# Patient Record
Sex: Female | Born: 1935 | Race: White | Hispanic: No | Smoking: Never smoker
Health system: Southern US, Community
[De-identification: ages and names within clinical notes are randomized; demographics above are authoritative.]

## PROBLEM LIST (undated history)

## (undated) DIAGNOSIS — I499 Cardiac arrhythmia, unspecified: Secondary | ICD-10-CM

## (undated) DIAGNOSIS — M1990 Unspecified osteoarthritis, unspecified site: Secondary | ICD-10-CM

## (undated) DIAGNOSIS — I10 Essential (primary) hypertension: Secondary | ICD-10-CM

## (undated) HISTORY — PX: ABDOMINAL HYSTERECTOMY: SHX81

## (undated) HISTORY — PX: KNEE SURGERY: SHX244

## (undated) HISTORY — PX: WRIST SURGERY: SHX841

## (undated) HISTORY — PX: BREAST LUMPECTOMY: SHX2

## (undated) HISTORY — PX: OTHER SURGICAL HISTORY: SHX169

---

## 2005-06-06 ENCOUNTER — Ambulatory Visit: Payer: Self-pay | Admitting: Internal Medicine

## 2006-06-17 ENCOUNTER — Ambulatory Visit: Payer: Self-pay | Admitting: Internal Medicine

## 2006-09-24 ENCOUNTER — Ambulatory Visit: Payer: Self-pay | Admitting: Ophthalmology

## 2007-06-19 ENCOUNTER — Ambulatory Visit: Payer: Self-pay | Admitting: Internal Medicine

## 2008-03-14 ENCOUNTER — Ambulatory Visit: Payer: Self-pay | Admitting: Unknown Physician Specialty

## 2008-03-24 ENCOUNTER — Inpatient Hospital Stay: Payer: Self-pay | Admitting: Unknown Physician Specialty

## 2008-04-05 ENCOUNTER — Ambulatory Visit: Payer: Self-pay | Admitting: Internal Medicine

## 2008-04-28 ENCOUNTER — Ambulatory Visit: Payer: Self-pay | Admitting: Unknown Physician Specialty

## 2009-05-24 ENCOUNTER — Ambulatory Visit: Payer: Self-pay | Admitting: Internal Medicine

## 2010-07-25 ENCOUNTER — Ambulatory Visit: Payer: Self-pay | Admitting: Internal Medicine

## 2010-11-09 ENCOUNTER — Ambulatory Visit: Payer: Self-pay | Admitting: Ophthalmology

## 2011-09-04 ENCOUNTER — Ambulatory Visit: Payer: Self-pay | Admitting: Podiatry

## 2011-09-27 ENCOUNTER — Ambulatory Visit: Payer: Self-pay | Admitting: Internal Medicine

## 2012-09-28 ENCOUNTER — Ambulatory Visit: Payer: Self-pay | Admitting: Physician Assistant

## 2013-07-05 ENCOUNTER — Ambulatory Visit: Payer: Self-pay | Admitting: Otolaryngology

## 2013-07-05 LAB — CREATININE, SERUM: EGFR (Non-African Amer.): >60

## 2013-07-27 ENCOUNTER — Ambulatory Visit: Payer: Self-pay | Admitting: Neurology

## 2013-09-08 DIAGNOSIS — G500 Trigeminal neuralgia: Secondary | ICD-10-CM | POA: Insufficient documentation

## 2013-10-04 ENCOUNTER — Ambulatory Visit: Payer: Self-pay | Admitting: Physician Assistant

## 2014-05-19 DIAGNOSIS — Z96652 Presence of left artificial knee joint: Secondary | ICD-10-CM | POA: Insufficient documentation

## 2015-03-08 DIAGNOSIS — M810 Age-related osteoporosis without current pathological fracture: Secondary | ICD-10-CM | POA: Insufficient documentation

## 2015-03-14 ENCOUNTER — Ambulatory Visit
Admit: 2015-03-14 | Disposition: A | Discharge status: Home / Self Care | Payer: Self-pay | Attending: Physician Assistant | Admitting: Physician Assistant

## 2016-03-13 DIAGNOSIS — M150 Primary generalized (osteo)arthritis: Secondary | ICD-10-CM | POA: Diagnosis not present

## 2016-03-13 DIAGNOSIS — I10 Essential (primary) hypertension: Secondary | ICD-10-CM | POA: Diagnosis not present

## 2016-03-13 DIAGNOSIS — M810 Age-related osteoporosis without current pathological fracture: Secondary | ICD-10-CM | POA: Diagnosis not present

## 2016-03-13 DIAGNOSIS — E782 Mixed hyperlipidemia: Secondary | ICD-10-CM | POA: Diagnosis not present

## 2016-05-02 DIAGNOSIS — M1711 Unilateral primary osteoarthritis, right knee: Secondary | ICD-10-CM | POA: Diagnosis not present

## 2016-05-02 DIAGNOSIS — M25561 Pain in right knee: Secondary | ICD-10-CM | POA: Diagnosis not present

## 2016-05-24 DIAGNOSIS — R42 Dizziness and giddiness: Secondary | ICD-10-CM | POA: Diagnosis not present

## 2016-05-24 DIAGNOSIS — R55 Syncope and collapse: Secondary | ICD-10-CM | POA: Diagnosis not present

## 2016-05-31 DIAGNOSIS — I6523 Occlusion and stenosis of bilateral carotid arteries: Secondary | ICD-10-CM | POA: Diagnosis not present

## 2016-05-31 DIAGNOSIS — R42 Dizziness and giddiness: Secondary | ICD-10-CM | POA: Diagnosis not present

## 2016-06-27 DIAGNOSIS — M1711 Unilateral primary osteoarthritis, right knee: Secondary | ICD-10-CM | POA: Diagnosis not present

## 2016-07-04 DIAGNOSIS — M1711 Unilateral primary osteoarthritis, right knee: Secondary | ICD-10-CM | POA: Diagnosis not present

## 2016-07-11 DIAGNOSIS — M1711 Unilateral primary osteoarthritis, right knee: Secondary | ICD-10-CM | POA: Diagnosis not present

## 2016-09-12 DIAGNOSIS — E782 Mixed hyperlipidemia: Secondary | ICD-10-CM | POA: Diagnosis not present

## 2016-09-12 DIAGNOSIS — I10 Essential (primary) hypertension: Secondary | ICD-10-CM | POA: Diagnosis not present

## 2016-09-12 DIAGNOSIS — G500 Trigeminal neuralgia: Secondary | ICD-10-CM | POA: Diagnosis not present

## 2016-09-12 DIAGNOSIS — M818 Other osteoporosis without current pathological fracture: Secondary | ICD-10-CM | POA: Diagnosis not present

## 2016-09-12 DIAGNOSIS — H04123 Dry eye syndrome of bilateral lacrimal glands: Secondary | ICD-10-CM | POA: Diagnosis not present

## 2016-09-12 DIAGNOSIS — R202 Paresthesia of skin: Secondary | ICD-10-CM | POA: Diagnosis not present

## 2016-09-12 DIAGNOSIS — M150 Primary generalized (osteo)arthritis: Secondary | ICD-10-CM | POA: Diagnosis not present

## 2016-09-30 DIAGNOSIS — K529 Noninfective gastroenteritis and colitis, unspecified: Secondary | ICD-10-CM | POA: Diagnosis not present

## 2016-09-30 DIAGNOSIS — R197 Diarrhea, unspecified: Secondary | ICD-10-CM | POA: Diagnosis not present

## 2016-09-30 DIAGNOSIS — R140 Abdominal distension (gaseous): Secondary | ICD-10-CM | POA: Diagnosis not present

## 2016-10-01 DIAGNOSIS — R140 Abdominal distension (gaseous): Secondary | ICD-10-CM | POA: Diagnosis not present

## 2016-10-01 DIAGNOSIS — K529 Noninfective gastroenteritis and colitis, unspecified: Secondary | ICD-10-CM | POA: Diagnosis not present

## 2016-10-15 DIAGNOSIS — K529 Noninfective gastroenteritis and colitis, unspecified: Secondary | ICD-10-CM | POA: Diagnosis not present

## 2016-10-15 DIAGNOSIS — A0472 Enterocolitis due to Clostridium difficile, not specified as recurrent: Secondary | ICD-10-CM | POA: Diagnosis not present

## 2016-11-13 DIAGNOSIS — J029 Acute pharyngitis, unspecified: Secondary | ICD-10-CM | POA: Diagnosis not present

## 2016-11-13 DIAGNOSIS — J069 Acute upper respiratory infection, unspecified: Secondary | ICD-10-CM | POA: Diagnosis not present

## 2016-11-29 DIAGNOSIS — H8111 Benign paroxysmal vertigo, right ear: Secondary | ICD-10-CM | POA: Diagnosis not present

## 2017-03-18 DIAGNOSIS — Z0000 Encounter for general adult medical examination without abnormal findings: Secondary | ICD-10-CM | POA: Diagnosis not present

## 2017-03-18 DIAGNOSIS — I10 Essential (primary) hypertension: Secondary | ICD-10-CM | POA: Diagnosis not present

## 2017-03-18 DIAGNOSIS — M818 Other osteoporosis without current pathological fracture: Secondary | ICD-10-CM | POA: Diagnosis not present

## 2017-03-18 DIAGNOSIS — G500 Trigeminal neuralgia: Secondary | ICD-10-CM | POA: Diagnosis not present

## 2017-03-18 DIAGNOSIS — R739 Hyperglycemia, unspecified: Secondary | ICD-10-CM | POA: Diagnosis not present

## 2017-03-18 DIAGNOSIS — E782 Mixed hyperlipidemia: Secondary | ICD-10-CM | POA: Diagnosis not present

## 2017-03-18 DIAGNOSIS — R202 Paresthesia of skin: Secondary | ICD-10-CM | POA: Diagnosis not present

## 2017-06-26 DIAGNOSIS — M19031 Primary osteoarthritis, right wrist: Secondary | ICD-10-CM | POA: Diagnosis not present

## 2017-06-26 DIAGNOSIS — G5601 Carpal tunnel syndrome, right upper limb: Secondary | ICD-10-CM | POA: Diagnosis not present

## 2017-06-26 DIAGNOSIS — M25531 Pain in right wrist: Secondary | ICD-10-CM | POA: Diagnosis not present

## 2017-07-01 DIAGNOSIS — M19031 Primary osteoarthritis, right wrist: Secondary | ICD-10-CM | POA: Diagnosis not present

## 2017-07-05 ENCOUNTER — Emergency Department
Admission: EM | Admit: 2017-07-05 | Discharge: 2017-07-05 | Disposition: A | Discharge status: Home / Self Care | Payer: PPO | Attending: Emergency Medicine | Admitting: Emergency Medicine

## 2017-07-05 ENCOUNTER — Emergency Department: Payer: PPO

## 2017-07-05 DIAGNOSIS — M542 Cervicalgia: Secondary | ICD-10-CM | POA: Diagnosis not present

## 2017-07-05 DIAGNOSIS — W108XXA Fall (on) (from) other stairs and steps, initial encounter: Secondary | ICD-10-CM | POA: Insufficient documentation

## 2017-07-05 DIAGNOSIS — S199XXA Unspecified injury of neck, initial encounter: Secondary | ICD-10-CM | POA: Diagnosis not present

## 2017-07-05 DIAGNOSIS — Z79899 Other long term (current) drug therapy: Secondary | ICD-10-CM | POA: Diagnosis not present

## 2017-07-05 DIAGNOSIS — S0101XA Laceration without foreign body of scalp, initial encounter: Secondary | ICD-10-CM | POA: Diagnosis not present

## 2017-07-05 DIAGNOSIS — W01198A Fall on same level from slipping, tripping and stumbling with subsequent striking against other object, initial encounter: Secondary | ICD-10-CM | POA: Diagnosis not present

## 2017-07-05 DIAGNOSIS — Y999 Unspecified external cause status: Secondary | ICD-10-CM | POA: Diagnosis not present

## 2017-07-05 DIAGNOSIS — I10 Essential (primary) hypertension: Secondary | ICD-10-CM | POA: Diagnosis not present

## 2017-07-05 DIAGNOSIS — Y92017 Garden or yard in single-family (private) house as the place of occurrence of the external cause: Secondary | ICD-10-CM | POA: Insufficient documentation

## 2017-07-05 DIAGNOSIS — W19XXXA Unspecified fall, initial encounter: Secondary | ICD-10-CM

## 2017-07-05 DIAGNOSIS — S0990XA Unspecified injury of head, initial encounter: Secondary | ICD-10-CM

## 2017-07-05 DIAGNOSIS — Y93H2 Activity, gardening and landscaping: Secondary | ICD-10-CM | POA: Diagnosis not present

## 2017-07-05 HISTORY — DX: Essential (primary) hypertension: I10

## 2017-07-05 MED ORDER — LIDOCAINE HCL (PF) 1 % IJ SOLN
5.0000 mL | Freq: Once | INTRAMUSCULAR | Status: AC
  Administered 2017-07-05: 5 mL via INTRADERMAL
  Filled 2017-07-05: qty 5

## 2017-07-05 NOTE — ED Notes (Signed)
Pt discharged to home.  Family member driving.  Discharge instructions reviewed.  Verbalized understanding.  No questions or concerns at this time.  Teach back verified.  Pt in NAD.  No items left in ED.   

## 2017-07-05 NOTE — ED Provider Notes (Signed)
[facility] Emergency Department Provider Note   ____________________________________________   I have reviewed the triage vital signs and the nursing notes.   HISTORY  Chief Complaint Fall   History limited by: Not Limited   HPI Debra Gutierrez is a 83 y.o. female who presents to the emergency department today after a fall. The patient was working on her garden when she fell backwards. She thinks she might have missed a step going down. She did hit the back of her head against a metal carport pole and thinks she might have briefly lost consciousness. She is having a little bit of pain to the back of her head. When she noticed bleeding she walked herself to her neighbors house. She denies any chest pain or shortness of breath. No recent illness.    Past Medical History:  Diagnosis Date  . Hypertension     There are no active problems to display for this patient.   Past Surgical History:  Procedure Laterality Date  . BREAST LUMPECTOMY    . KNEE SURGERY      Prior to Admission medications   Not on File    Allergies Daypro [oxaprozin] and Penicillins  No family history on file.  Social History Social History  Substance Use Topics  . Smoking status: Never Smoker  . Smokeless tobacco: Never Used  . Alcohol use No    Review of Systems Constitutional: No fever/chills Eyes: Brief episode of blurry vision immediately after the injury. ENT: No sore throat. Cardiovascular: Denies chest pain. Respiratory: Denies shortness of breath. Gastrointestinal: No abdominal pain.  No nausea, no vomiting.  No diarrhea.   Genitourinary: Negative for dysuria. Musculoskeletal: Negative for back pain. Skin: Positive for laceration to the back of her head. Neurological: Negative for headaches, focal weakness or numbness.  ____________________________________________   PHYSICAL EXAM:  VITAL SIGNS: ED Triage Vitals  Enc Vitals Group     BP 07/05/17  2049 (!) 205/93     Pulse Rate 07/05/17 2049 (!) 52     Resp 07/05/17 2049 18     Temp 07/05/17 2049 98.4 F (36.9 C)     Temp Source 07/05/17 2049 Oral     SpO2 07/05/17 2049 97 %     Weight 07/05/17 2050 136 lb (61.7 kg)     Height 07/05/17 2050 5\' 2"  (1.575 m)     Head Circumference --      Peak Flow --      Pain Score 07/05/17 2049 10    Constitutional: Alert and oriented. Well appearing and in no distress. Eyes: Conjunctivae are normal.  ENT   Head: Normocephalic. Hematoma and laceration to back of head.   Nose: No congestion/rhinnorhea.   Mouth/Throat: Mucous membranes are moist.   Neck: No stridor. Hematological/Lymphatic/Immunilogical: No cervical lymphadenopathy. Cardiovascular: Normal rate, regular rhythm.  No murmurs, rubs, or gallops.  Respiratory: Normal respiratory effort without tachypnea nor retractions. Breath sounds are clear and equal bilaterally. No wheezes/rales/rhonchi. Gastrointestinal: Soft and non tender. No rebound. No guarding.  Genitourinary: Deferred Musculoskeletal: Normal range of motion in all extremities. No lower extremity edema. Neurologic:  Normal speech and language. No gross focal neurologic deficits are appreciated.  Skin:  Skin is warm, dry and intact. No rash noted. Psychiatric: Mood and affect are normal. Speech and behavior are normal. Patient exhibits appropriate insight and judgment.  ____________________________________________    LABS (pertinent positives/negatives)  None  ____________________________________________   EKG  None  ____________________________________________    RADIOLOGY  CT head/cervical spine IMPRESSION:  1. No acute intracranial hemorrhage.  2. No acute/traumatic cervical spine pathology.      ____________________________________________   PROCEDURES  Procedures  LACERATION REPAIR Performed by: Phineas SemenGOODMAN, Steffi Noviello Authorized by: Phineas SemenGOODMAN, Katrianna Friesenhahn Consent: Verbal consent  obtained. Risks and benefits: risks, benefits and alternatives were discussed Consent given by: patient Patient identity confirmed: provided demographic data Prepped and Draped in normal sterile fashion Wound explored  Laceration Location: occiput of scalp  Laceration Length: 4 cm  No Foreign Bodies seen or palpated  Anesthesia: local infiltration  Local anesthetic: lidocaine 1% without epinephrine  Anesthetic total: 5 ml  Irrigation method: syringe Amount of cleaning: standard  Skin closure: staples  Number of sutures: 8  Technique: staple  Patient tolerance: Patient tolerated the procedure well with no immediate complications.  ____________________________________________   INITIAL IMPRESSION / ASSESSMENT AND PLAN / ED COURSE  Pertinent labs & imaging results that were available during my care of the patient were reviewed by me and considered in my medical decision making (see chart for details).  Patient presented to the emergency department today after a mechanical fall, falling backwards and hitting her head. The patient did suffer roughly 4 cm laceration and hematoma. CT of the head and neck were negative. Patient's laceration was closed with staples. Patient states last tetanus shot 6 years ago. Wound is not contaminated. Discussed return precautions with patient.   ____________________________________________   FINAL CLINICAL IMPRESSION(S) / ED DIAGNOSES  Final diagnoses:  Fall, initial encounter  Injury of head, initial encounter  Laceration of scalp, initial encounter     Note: This dictation was prepared with Nurse, children'sDragon dictation. Any transcriptional errors that result from this process are unintentional     Phineas SemenGoodman, Yon Schiffman, MD 07/05/17 2231

## 2017-07-05 NOTE — ED Notes (Signed)
Signature pad not working, verbalized understanding of discharge instructions.

## 2017-07-05 NOTE — Discharge Instructions (Signed)
Please seek medical attention for any high fevers, chest pain, shortness of breath, change in behavior, persistent vomiting, bloody stool or any other new or concerning symptoms.  

## 2017-07-05 NOTE — ED Triage Notes (Signed)
Pt arrived via [facility] from home.  Pt was picking flowers from her garden and fell backwards.  Pt hit the back of her head and has approx 4cm lac to back of head.  PT denies LOC.  Pt was able to ambulate across the street to her neighbors house after fall and call EMS from there.  Pt A&Ox4 and ambulatory from stretcher to bed in ER.  Pt's BP elevated per EMS.  PT states that she has taken her BP medication today.

## 2017-07-14 DIAGNOSIS — S0990XA Unspecified injury of head, initial encounter: Secondary | ICD-10-CM | POA: Diagnosis not present

## 2017-07-14 DIAGNOSIS — Z4802 Encounter for removal of sutures: Secondary | ICD-10-CM | POA: Diagnosis not present

## 2017-07-14 DIAGNOSIS — G500 Trigeminal neuralgia: Secondary | ICD-10-CM | POA: Diagnosis not present

## 2017-08-22 DIAGNOSIS — Z23 Encounter for immunization: Secondary | ICD-10-CM | POA: Diagnosis not present

## 2017-08-29 DIAGNOSIS — Z9889 Other specified postprocedural states: Secondary | ICD-10-CM | POA: Diagnosis not present

## 2017-08-29 DIAGNOSIS — Z8669 Personal history of other diseases of the nervous system and sense organs: Secondary | ICD-10-CM | POA: Diagnosis not present

## 2017-08-29 DIAGNOSIS — G500 Trigeminal neuralgia: Secondary | ICD-10-CM | POA: Diagnosis not present

## 2017-09-18 DIAGNOSIS — R7303 Prediabetes: Secondary | ICD-10-CM | POA: Diagnosis not present

## 2017-09-18 DIAGNOSIS — M818 Other osteoporosis without current pathological fracture: Secondary | ICD-10-CM | POA: Diagnosis not present

## 2017-09-18 DIAGNOSIS — R55 Syncope and collapse: Secondary | ICD-10-CM | POA: Diagnosis not present

## 2017-09-18 DIAGNOSIS — E782 Mixed hyperlipidemia: Secondary | ICD-10-CM | POA: Diagnosis not present

## 2017-09-18 DIAGNOSIS — R42 Dizziness and giddiness: Secondary | ICD-10-CM | POA: Diagnosis not present

## 2017-09-18 DIAGNOSIS — G500 Trigeminal neuralgia: Secondary | ICD-10-CM | POA: Diagnosis not present

## 2017-09-18 DIAGNOSIS — I10 Essential (primary) hypertension: Secondary | ICD-10-CM | POA: Diagnosis not present

## 2017-10-02 DIAGNOSIS — R55 Syncope and collapse: Secondary | ICD-10-CM | POA: Diagnosis not present

## 2017-10-02 DIAGNOSIS — R42 Dizziness and giddiness: Secondary | ICD-10-CM | POA: Diagnosis not present

## 2017-10-02 DIAGNOSIS — I6523 Occlusion and stenosis of bilateral carotid arteries: Secondary | ICD-10-CM | POA: Diagnosis not present

## 2017-12-30 DIAGNOSIS — K921 Melena: Secondary | ICD-10-CM | POA: Diagnosis not present

## 2018-03-24 DIAGNOSIS — Z0000 Encounter for general adult medical examination without abnormal findings: Secondary | ICD-10-CM | POA: Diagnosis not present

## 2018-03-24 DIAGNOSIS — E782 Mixed hyperlipidemia: Secondary | ICD-10-CM | POA: Diagnosis not present

## 2018-03-24 DIAGNOSIS — I10 Essential (primary) hypertension: Secondary | ICD-10-CM | POA: Diagnosis not present

## 2018-03-24 DIAGNOSIS — M818 Other osteoporosis without current pathological fracture: Secondary | ICD-10-CM | POA: Diagnosis not present

## 2018-03-24 DIAGNOSIS — M150 Primary generalized (osteo)arthritis: Secondary | ICD-10-CM | POA: Diagnosis not present

## 2018-03-24 DIAGNOSIS — R7303 Prediabetes: Secondary | ICD-10-CM | POA: Diagnosis not present

## 2018-06-05 DIAGNOSIS — H01003 Unspecified blepharitis right eye, unspecified eyelid: Secondary | ICD-10-CM | POA: Diagnosis not present

## 2018-09-16 DIAGNOSIS — I10 Essential (primary) hypertension: Secondary | ICD-10-CM | POA: Diagnosis not present

## 2018-09-16 DIAGNOSIS — E782 Mixed hyperlipidemia: Secondary | ICD-10-CM | POA: Diagnosis not present

## 2018-09-16 DIAGNOSIS — R7303 Prediabetes: Secondary | ICD-10-CM | POA: Diagnosis not present

## 2018-09-23 DIAGNOSIS — G500 Trigeminal neuralgia: Secondary | ICD-10-CM | POA: Diagnosis not present

## 2018-09-23 DIAGNOSIS — Z23 Encounter for immunization: Secondary | ICD-10-CM | POA: Diagnosis not present

## 2018-09-23 DIAGNOSIS — I10 Essential (primary) hypertension: Secondary | ICD-10-CM | POA: Diagnosis not present

## 2018-09-23 DIAGNOSIS — R7303 Prediabetes: Secondary | ICD-10-CM | POA: Diagnosis not present

## 2018-09-23 DIAGNOSIS — M150 Primary generalized (osteo)arthritis: Secondary | ICD-10-CM | POA: Diagnosis not present

## 2018-09-23 DIAGNOSIS — E782 Mixed hyperlipidemia: Secondary | ICD-10-CM | POA: Diagnosis not present

## 2019-06-03 DIAGNOSIS — R7303 Prediabetes: Secondary | ICD-10-CM | POA: Diagnosis not present

## 2019-06-03 DIAGNOSIS — I10 Essential (primary) hypertension: Secondary | ICD-10-CM | POA: Diagnosis not present

## 2019-06-03 DIAGNOSIS — E782 Mixed hyperlipidemia: Secondary | ICD-10-CM | POA: Diagnosis not present

## 2019-06-07 DIAGNOSIS — E782 Mixed hyperlipidemia: Secondary | ICD-10-CM | POA: Diagnosis not present

## 2019-06-07 DIAGNOSIS — R7303 Prediabetes: Secondary | ICD-10-CM | POA: Diagnosis not present

## 2019-06-07 DIAGNOSIS — I10 Essential (primary) hypertension: Secondary | ICD-10-CM | POA: Diagnosis not present

## 2019-06-07 DIAGNOSIS — G500 Trigeminal neuralgia: Secondary | ICD-10-CM | POA: Diagnosis not present

## 2019-06-07 DIAGNOSIS — Z0000 Encounter for general adult medical examination without abnormal findings: Secondary | ICD-10-CM | POA: Diagnosis not present

## 2019-09-16 ENCOUNTER — Other Ambulatory Visit: Payer: Self-pay | Admitting: Physician Assistant

## 2019-09-16 DIAGNOSIS — R1011 Right upper quadrant pain: Secondary | ICD-10-CM

## 2019-09-16 DIAGNOSIS — R197 Diarrhea, unspecified: Secondary | ICD-10-CM

## 2019-09-24 ENCOUNTER — Ambulatory Visit
Admission: RE | Admit: 2019-09-24 | Discharge: 2019-09-24 | Disposition: A | Discharge status: Home / Self Care | Payer: PPO | Source: Ambulatory Visit | Attending: Physician Assistant | Admitting: Physician Assistant

## 2019-09-24 ENCOUNTER — Other Ambulatory Visit: Payer: Self-pay

## 2019-09-24 DIAGNOSIS — R1011 Right upper quadrant pain: Secondary | ICD-10-CM | POA: Insufficient documentation

## 2019-09-24 DIAGNOSIS — R197 Diarrhea, unspecified: Secondary | ICD-10-CM

## 2019-09-24 DIAGNOSIS — K7689 Other specified diseases of liver: Secondary | ICD-10-CM | POA: Diagnosis not present

## 2019-09-24 DIAGNOSIS — K828 Other specified diseases of gallbladder: Secondary | ICD-10-CM | POA: Diagnosis not present

## 2019-10-18 DIAGNOSIS — Z961 Presence of intraocular lens: Secondary | ICD-10-CM | POA: Diagnosis not present

## 2019-12-06 DIAGNOSIS — R7303 Prediabetes: Secondary | ICD-10-CM | POA: Diagnosis not present

## 2019-12-06 DIAGNOSIS — G500 Trigeminal neuralgia: Secondary | ICD-10-CM | POA: Diagnosis not present

## 2019-12-06 DIAGNOSIS — I10 Essential (primary) hypertension: Secondary | ICD-10-CM | POA: Diagnosis not present

## 2019-12-06 DIAGNOSIS — E782 Mixed hyperlipidemia: Secondary | ICD-10-CM | POA: Diagnosis not present

## 2019-12-06 DIAGNOSIS — E538 Deficiency of other specified B group vitamins: Secondary | ICD-10-CM | POA: Diagnosis not present

## 2019-12-08 DIAGNOSIS — M8949 Other hypertrophic osteoarthropathy, multiple sites: Secondary | ICD-10-CM | POA: Diagnosis not present

## 2019-12-08 DIAGNOSIS — E782 Mixed hyperlipidemia: Secondary | ICD-10-CM | POA: Diagnosis not present

## 2019-12-08 DIAGNOSIS — Z0000 Encounter for general adult medical examination without abnormal findings: Secondary | ICD-10-CM | POA: Diagnosis not present

## 2019-12-08 DIAGNOSIS — E538 Deficiency of other specified B group vitamins: Secondary | ICD-10-CM | POA: Diagnosis not present

## 2019-12-08 DIAGNOSIS — R7303 Prediabetes: Secondary | ICD-10-CM | POA: Diagnosis not present

## 2019-12-08 DIAGNOSIS — I10 Essential (primary) hypertension: Secondary | ICD-10-CM | POA: Diagnosis not present

## 2019-12-08 DIAGNOSIS — H9190 Unspecified hearing loss, unspecified ear: Secondary | ICD-10-CM | POA: Diagnosis not present

## 2019-12-08 DIAGNOSIS — G500 Trigeminal neuralgia: Secondary | ICD-10-CM | POA: Diagnosis not present

## 2019-12-22 DIAGNOSIS — G253 Myoclonus: Secondary | ICD-10-CM | POA: Diagnosis not present

## 2019-12-22 DIAGNOSIS — H9319 Tinnitus, unspecified ear: Secondary | ICD-10-CM | POA: Diagnosis not present

## 2019-12-22 DIAGNOSIS — S0432XA Injury of trigeminal nerve, left side, initial encounter: Secondary | ICD-10-CM | POA: Diagnosis not present

## 2019-12-22 DIAGNOSIS — H903 Sensorineural hearing loss, bilateral: Secondary | ICD-10-CM | POA: Diagnosis not present

## 2020-06-01 DIAGNOSIS — I10 Essential (primary) hypertension: Secondary | ICD-10-CM | POA: Diagnosis not present

## 2020-06-01 DIAGNOSIS — E782 Mixed hyperlipidemia: Secondary | ICD-10-CM | POA: Diagnosis not present

## 2020-06-01 DIAGNOSIS — R7303 Prediabetes: Secondary | ICD-10-CM | POA: Diagnosis not present

## 2020-06-08 DIAGNOSIS — Z23 Encounter for immunization: Secondary | ICD-10-CM | POA: Diagnosis not present

## 2020-06-08 DIAGNOSIS — G500 Trigeminal neuralgia: Secondary | ICD-10-CM | POA: Diagnosis not present

## 2020-06-08 DIAGNOSIS — Z0000 Encounter for general adult medical examination without abnormal findings: Secondary | ICD-10-CM | POA: Diagnosis not present

## 2020-06-08 DIAGNOSIS — E782 Mixed hyperlipidemia: Secondary | ICD-10-CM | POA: Diagnosis not present

## 2020-06-08 DIAGNOSIS — M8949 Other hypertrophic osteoarthropathy, multiple sites: Secondary | ICD-10-CM | POA: Diagnosis not present

## 2020-06-08 DIAGNOSIS — R7303 Prediabetes: Secondary | ICD-10-CM | POA: Diagnosis not present

## 2020-06-08 DIAGNOSIS — I10 Essential (primary) hypertension: Secondary | ICD-10-CM | POA: Diagnosis not present

## 2020-08-30 DIAGNOSIS — I10 Essential (primary) hypertension: Secondary | ICD-10-CM | POA: Diagnosis not present

## 2020-08-30 DIAGNOSIS — Z23 Encounter for immunization: Secondary | ICD-10-CM | POA: Diagnosis not present

## 2020-09-19 DIAGNOSIS — N9089 Other specified noninflammatory disorders of vulva and perineum: Secondary | ICD-10-CM | POA: Diagnosis not present

## 2020-12-14 DIAGNOSIS — R42 Dizziness and giddiness: Secondary | ICD-10-CM | POA: Diagnosis not present

## 2020-12-14 DIAGNOSIS — I10 Essential (primary) hypertension: Secondary | ICD-10-CM | POA: Diagnosis not present

## 2021-01-08 DIAGNOSIS — E782 Mixed hyperlipidemia: Secondary | ICD-10-CM | POA: Diagnosis not present

## 2021-01-08 DIAGNOSIS — Z0000 Encounter for general adult medical examination without abnormal findings: Secondary | ICD-10-CM | POA: Diagnosis not present

## 2021-01-08 DIAGNOSIS — E538 Deficiency of other specified B group vitamins: Secondary | ICD-10-CM | POA: Diagnosis not present

## 2021-01-08 DIAGNOSIS — I10 Essential (primary) hypertension: Secondary | ICD-10-CM | POA: Diagnosis not present

## 2021-01-08 DIAGNOSIS — G500 Trigeminal neuralgia: Secondary | ICD-10-CM | POA: Diagnosis not present

## 2021-01-08 DIAGNOSIS — R7303 Prediabetes: Secondary | ICD-10-CM | POA: Diagnosis not present

## 2021-01-08 DIAGNOSIS — Z23 Encounter for immunization: Secondary | ICD-10-CM | POA: Diagnosis not present

## 2021-01-08 DIAGNOSIS — M8949 Other hypertrophic osteoarthropathy, multiple sites: Secondary | ICD-10-CM | POA: Diagnosis not present

## 2021-01-08 DIAGNOSIS — H9190 Unspecified hearing loss, unspecified ear: Secondary | ICD-10-CM | POA: Diagnosis not present

## 2021-03-12 DIAGNOSIS — M545 Low back pain, unspecified: Secondary | ICD-10-CM | POA: Diagnosis not present

## 2021-03-12 DIAGNOSIS — G8929 Other chronic pain: Secondary | ICD-10-CM | POA: Diagnosis not present

## 2021-04-10 DIAGNOSIS — K625 Hemorrhage of anus and rectum: Secondary | ICD-10-CM | POA: Diagnosis not present

## 2021-04-10 DIAGNOSIS — R000 Tachycardia, unspecified: Secondary | ICD-10-CM | POA: Diagnosis not present

## 2021-04-10 DIAGNOSIS — I10 Essential (primary) hypertension: Secondary | ICD-10-CM | POA: Diagnosis not present

## 2021-04-12 DIAGNOSIS — K625 Hemorrhage of anus and rectum: Secondary | ICD-10-CM | POA: Diagnosis not present

## 2021-05-10 DIAGNOSIS — K5900 Constipation, unspecified: Secondary | ICD-10-CM | POA: Diagnosis not present

## 2021-05-10 DIAGNOSIS — K625 Hemorrhage of anus and rectum: Secondary | ICD-10-CM | POA: Diagnosis not present

## 2021-05-10 DIAGNOSIS — Z8719 Personal history of other diseases of the digestive system: Secondary | ICD-10-CM | POA: Diagnosis not present

## 2021-05-14 DIAGNOSIS — K625 Hemorrhage of anus and rectum: Secondary | ICD-10-CM | POA: Diagnosis not present

## 2021-06-05 DIAGNOSIS — E782 Mixed hyperlipidemia: Secondary | ICD-10-CM | POA: Diagnosis not present

## 2021-06-05 DIAGNOSIS — E538 Deficiency of other specified B group vitamins: Secondary | ICD-10-CM | POA: Diagnosis not present

## 2021-06-05 DIAGNOSIS — R7303 Prediabetes: Secondary | ICD-10-CM | POA: Diagnosis not present

## 2021-06-05 DIAGNOSIS — I10 Essential (primary) hypertension: Secondary | ICD-10-CM | POA: Diagnosis not present

## 2021-06-12 DIAGNOSIS — Z0000 Encounter for general adult medical examination without abnormal findings: Secondary | ICD-10-CM | POA: Diagnosis not present

## 2021-06-12 DIAGNOSIS — M159 Polyosteoarthritis, unspecified: Secondary | ICD-10-CM | POA: Diagnosis not present

## 2021-06-12 DIAGNOSIS — I10 Essential (primary) hypertension: Secondary | ICD-10-CM | POA: Diagnosis not present

## 2021-06-12 DIAGNOSIS — M810 Age-related osteoporosis without current pathological fracture: Secondary | ICD-10-CM | POA: Diagnosis not present

## 2021-06-12 DIAGNOSIS — R7303 Prediabetes: Secondary | ICD-10-CM | POA: Diagnosis not present

## 2021-06-12 DIAGNOSIS — Z780 Asymptomatic menopausal state: Secondary | ICD-10-CM | POA: Diagnosis not present

## 2021-06-12 DIAGNOSIS — I499 Cardiac arrhythmia, unspecified: Secondary | ICD-10-CM | POA: Diagnosis not present

## 2021-06-12 DIAGNOSIS — E782 Mixed hyperlipidemia: Secondary | ICD-10-CM | POA: Diagnosis not present

## 2021-06-12 DIAGNOSIS — G500 Trigeminal neuralgia: Secondary | ICD-10-CM | POA: Diagnosis not present

## 2021-06-12 DIAGNOSIS — E538 Deficiency of other specified B group vitamins: Secondary | ICD-10-CM | POA: Diagnosis not present

## 2021-06-25 DIAGNOSIS — M810 Age-related osteoporosis without current pathological fracture: Secondary | ICD-10-CM | POA: Diagnosis not present

## 2021-07-11 DIAGNOSIS — R198 Other specified symptoms and signs involving the digestive system and abdomen: Secondary | ICD-10-CM | POA: Diagnosis not present

## 2021-07-11 DIAGNOSIS — K625 Hemorrhage of anus and rectum: Secondary | ICD-10-CM | POA: Diagnosis not present

## 2021-08-24 DIAGNOSIS — K625 Hemorrhage of anus and rectum: Secondary | ICD-10-CM | POA: Diagnosis not present

## 2021-08-24 DIAGNOSIS — K769 Liver disease, unspecified: Secondary | ICD-10-CM | POA: Diagnosis not present

## 2021-08-24 DIAGNOSIS — N83202 Unspecified ovarian cyst, left side: Secondary | ICD-10-CM | POA: Diagnosis not present

## 2021-08-24 DIAGNOSIS — Z800 Family history of malignant neoplasm of digestive organs: Secondary | ICD-10-CM | POA: Diagnosis not present

## 2021-08-24 DIAGNOSIS — K648 Other hemorrhoids: Secondary | ICD-10-CM | POA: Diagnosis not present

## 2021-08-24 DIAGNOSIS — I10 Essential (primary) hypertension: Secondary | ICD-10-CM | POA: Diagnosis not present

## 2021-08-24 DIAGNOSIS — K922 Gastrointestinal hemorrhage, unspecified: Secondary | ICD-10-CM | POA: Diagnosis not present

## 2021-08-24 DIAGNOSIS — Z880 Allergy status to penicillin: Secondary | ICD-10-CM | POA: Diagnosis not present

## 2021-08-28 DIAGNOSIS — K5909 Other constipation: Secondary | ICD-10-CM | POA: Diagnosis not present

## 2021-08-28 DIAGNOSIS — K921 Melena: Secondary | ICD-10-CM | POA: Diagnosis not present

## 2021-08-28 DIAGNOSIS — R933 Abnormal findings on diagnostic imaging of other parts of digestive tract: Secondary | ICD-10-CM | POA: Diagnosis not present

## 2021-08-31 ENCOUNTER — Encounter: Payer: Self-pay | Admitting: *Deleted

## 2021-09-03 ENCOUNTER — Encounter: Payer: Self-pay | Admitting: *Deleted

## 2021-09-03 ENCOUNTER — Encounter
Admission: RE | Disposition: A | Discharge status: Home / Self Care | Payer: Self-pay | Source: Home / Self Care | Attending: Gastroenterology

## 2021-09-03 ENCOUNTER — Ambulatory Visit
Admission: RE | Admit: 2021-09-03 | Discharge: 2021-09-03 | Disposition: A | Discharge status: Home / Self Care | Payer: PPO | Attending: Gastroenterology | Admitting: Gastroenterology

## 2021-09-03 ENCOUNTER — Ambulatory Visit: Payer: PPO | Admitting: Anesthesiology

## 2021-09-03 DIAGNOSIS — K5730 Diverticulosis of large intestine without perforation or abscess without bleeding: Secondary | ICD-10-CM | POA: Diagnosis not present

## 2021-09-03 DIAGNOSIS — K640 First degree hemorrhoids: Secondary | ICD-10-CM | POA: Diagnosis not present

## 2021-09-03 DIAGNOSIS — Z79899 Other long term (current) drug therapy: Secondary | ICD-10-CM | POA: Diagnosis not present

## 2021-09-03 DIAGNOSIS — K649 Unspecified hemorrhoids: Secondary | ICD-10-CM | POA: Diagnosis not present

## 2021-09-03 DIAGNOSIS — R7303 Prediabetes: Secondary | ICD-10-CM | POA: Insufficient documentation

## 2021-09-03 DIAGNOSIS — K641 Second degree hemorrhoids: Secondary | ICD-10-CM | POA: Insufficient documentation

## 2021-09-03 DIAGNOSIS — Z880 Allergy status to penicillin: Secondary | ICD-10-CM | POA: Insufficient documentation

## 2021-09-03 DIAGNOSIS — Z538 Procedure and treatment not carried out for other reasons: Secondary | ICD-10-CM | POA: Diagnosis not present

## 2021-09-03 DIAGNOSIS — K921 Melena: Secondary | ICD-10-CM | POA: Diagnosis not present

## 2021-09-03 DIAGNOSIS — I10 Essential (primary) hypertension: Secondary | ICD-10-CM | POA: Insufficient documentation

## 2021-09-03 HISTORY — DX: Unspecified osteoarthritis, unspecified site: M19.90

## 2021-09-03 HISTORY — PX: COLONOSCOPY: SHX5424

## 2021-09-03 SURGERY — COLONOSCOPY
Anesthesia: General

## 2021-09-03 MED ORDER — PROPOFOL 500 MG/50ML IV EMUL
INTRAVENOUS | Status: DC | PRN
  Administered 2021-09-03: 120 ug/kg/min via INTRAVENOUS

## 2021-09-03 MED ORDER — PHENYLEPHRINE HCL (PRESSORS) 10 MG/ML IV SOLN
INTRAVENOUS | Status: DC | PRN
  Administered 2021-09-03 (×2): 50 ug via INTRAVENOUS

## 2021-09-03 MED ORDER — PROPOFOL 500 MG/50ML IV EMUL
INTRAVENOUS | Status: AC
  Filled 2021-09-03: qty 50

## 2021-09-03 MED ORDER — PHENYLEPHRINE HCL (PRESSORS) 10 MG/ML IV SOLN
INTRAVENOUS | Status: AC
  Filled 2021-09-03: qty 1

## 2021-09-03 MED ORDER — SODIUM CHLORIDE 0.9 % IV SOLN: INTRAVENOUS | Status: DC

## 2021-09-03 NOTE — Interval H&P Note (Signed)
History and Physical Interval Note: Preprocedure H&P from 09/03/21  was reviewed and there was no interval change after seeing and examining the patient.  Written consent was obtained from the patient after discussion of risks, benefits, and alternatives. Patient has consented to proceed with Colonoscopy with possible intervention   09/03/2021 11:53 AM  Debra Gutierrez  has presented today for surgery, with the diagnosis of (K92.1) HEMATOCHEZIA  (R93.3) ABNORMAL CT SCAN, COLON.  The various methods of treatment have been discussed with the patient and family. After consideration of risks, benefits and other options for treatment, the patient has consented to  Procedure(s): COLONOSCOPY (N/A) as a surgical intervention.  The patient's history has been reviewed, patient examined, no change in status, stable for surgery.  I have reviewed the patient's chart and labs.  Questions were answered to the patient's satisfaction.     Jaynie Collins

## 2021-09-03 NOTE — Anesthesia Procedure Notes (Signed)
Date/Time: 09/03/2021 12:38 PM Performed by: Tonia Ghent Pre-anesthesia Checklist: Patient identified, Emergency Drugs available, Suction available, Patient being monitored and Timeout performed Patient Re-evaluated:Patient Re-evaluated prior to induction Oxygen Delivery Method: Nasal cannula Preoxygenation: Pre-oxygenation with 100% oxygen Induction Type: IV induction Placement Confirmation: positive ETCO2 and CO2 detector

## 2021-09-03 NOTE — Op Note (Addendum)
[facility] Gastroenterology Patient Name: Debra Gutierrez Procedure Date: 09/03/2021 12:28 PM MRN: [id number] Account #: [id number] Date of Birth: 1930-01-24 Admit Type: Outpatient Age: 87 Room: [facility] ENDO ROOM 2 Gender: Female Note Status: Finalized Instrument Name: Colonscope [id number] Procedure:             Colonoscopy Indications:           Hematochezia Providers:             Annamaria Helling DO, DO Referring MD:          Precious Bard, MD (Referring MD) Medicines:             Monitored Anesthesia Care Complications:         No immediate complications. Estimated blood loss: None. Procedure:             Pre-Anesthesia Assessment:                        - Prior to the procedure, a History and Physical was                         performed, and patient medications and allergies were                         reviewed. The patient is competent. The risks and                         benefits of the procedure and the sedation options and                         risks were discussed with the patient. All questions                         were answered and informed consent was obtained.                         Patient identification and proposed procedure were                         verified by the physician, the nurse, the anesthetist                         and the technician in the endoscopy suite. Mental                         Status Examination: alert and oriented. Airway                         Examination: normal oropharyngeal airway and neck                         mobility. Respiratory Examination: clear to                         auscultation. CV Examination: RRR, no murmurs, no S3                         or S4. Prophylactic Antibiotics: The patient does not  require prophylactic antibiotics. Prior                         Anticoagulants: The patient has taken no previous                         anticoagulant or antiplatelet  agents. ASA Grade                         Assessment: II - A patient with mild systemic disease.                         After reviewing the risks and benefits, the patient                         was deemed in satisfactory condition to undergo the                         procedure. The anesthesia plan was to use monitored                         anesthesia care (MAC). Immediately prior to                         administration of medications, the patient was                         re-assessed for adequacy to receive sedatives. The                         heart rate, respiratory rate, oxygen saturations,                         blood pressure, adequacy of pulmonary ventilation, and                         response to care were monitored throughout the                         procedure. The physical status of the patient was                         re-assessed after the procedure.                        After obtaining informed consent, the colonoscope was                         passed under direct vision. Throughout the procedure,                         the patient's blood pressure, pulse, and oxygen                         saturations were monitored continuously. The                         Colonoscope was introduced through the anus with the  intention of advancing to the cecum. The scope was                         advanced to the sigmoid colon before the procedure was                         aborted. Medications were given. The colonoscopy was                         performed without difficulty. The patient tolerated                         the procedure well. The colonoscopy was aborted due to                         poor bowel prep with stool present. Lavage did not                         allow for the successful completion of the procedure.                         The quality of the bowel preparation was poor,                         inadequate and  unsatisfactory. Findings:      Hemorrhoids were found on perianal exam.      Non-bleeding internal hemorrhoids were found during retroflexion. The       hemorrhoids were Grade II (internal hemorrhoids that prolapse but reduce       spontaneously). Estimated blood loss: none.      Multiple small and large-mouthed diverticula were found in the left       colon. Estimated blood loss: none. Multiple diverticuli. Non inflammed.       non- bleeding.      Large amount of solid, brown stool noted in the colon. no old or fresh       blood noted. Advanced scope to 40 cm from anus. Procedure aborted.       Suspect bleeding 2/2 hemorrhoids vs diverticula. No AVM noted in sigmoid       colon as previously reported on CT Estimated blood loss: none. Impression:            - The procedure was aborted due to poor bowel prep                         with stool present.                        - Preparation of the colon was poor.                        - Preparation of the colon was unsatisfactory.                        - Preparation of the colon was inadequate.                        - Hemorrhoids found on perianal exam.                        -  Non-bleeding internal hemorrhoids.                        - Diverticulosis in the left colon.                        - No specimens collected. Recommendation:        - Discharge patient to home.                        - Resume previous diet.                        - Continue present medications.                        - Defer to mutual decision making with provider and                         patient in regards to repeat colonoscopy given age and                         comorbidities.                        - Return to referring physician as previously                         scheduled. Procedure Code(s):     --- Professional ---                        [phone], 1, Colonoscopy, flexible; diagnostic,                         including collection of specimen(s) by  brushing or                         washing, when performed (separate procedure) Diagnosis Code(s):     --- Professional ---                        K64.1, Second degree hemorrhoids                        K92.1, Melena (includes Hematochezia)                        K57.30, Diverticulosis of large intestine without                         perforation or abscess without bleeding CPT copyright 2019 American Medical Association. All rights reserved. The codes documented in this report are preliminary and upon coder review may  be revised to meet current compliance requirements. Attending Participation:      I personally performed the entire procedure. Volney American, DO Annamaria Helling DO, DO 09/03/2021 12:51:29 PM This report has been signed electronically. Number of Addenda: 0 Note Initiated On: 09/03/2021 12:28 PM Total Procedure Duration: 0 hours 6 minutes 56 seconds  Estimated Blood Loss:  Estimated blood loss: none.      [facility]

## 2021-09-03 NOTE — Anesthesia Preprocedure Evaluation (Addendum)
Anesthesia Evaluation  Patient identified by MRN, date of birth, ID band Patient awake    Reviewed: Allergy & Precautions, NPO status , Patient's Chart, lab work & pertinent test results  History of Anesthesia Complications Negative for: history of anesthetic complications  Airway Mallampati: III   Neck ROM: Full    Dental  (+)    Pulmonary neg pulmonary ROS,    Pulmonary exam normal breath sounds clear to auscultation       Cardiovascular hypertension, Normal cardiovascular exam Rhythm:Regular Rate:Normal  ECG 06/12/21:  Sinus rhythm with premature atrial complexes  Otherwise normal ECG     Neuro/Psych Trigeminal neuralgia; vertigo    GI/Hepatic negative GI ROS,   Endo/Other  Prediabetes   Renal/GU negative Renal ROS     Musculoskeletal  (+) Arthritis ,   Abdominal   Peds  Hematology negative hematology ROS (+)   Anesthesia Other Findings   Reproductive/Obstetrics                            Anesthesia Physical Anesthesia Plan  ASA: 2  Anesthesia Plan: General   Post-op Pain Management:    Induction: Intravenous  PONV Risk Score and Plan: 3 and Propofol infusion, TIVA and Treatment may vary due to age or medical condition  Airway Management Planned: Natural Airway  Additional Equipment:   Intra-op Plan:   Post-operative Plan:   Informed Consent: I have reviewed the patients History and Physical, chart, labs and discussed the procedure including the risks, benefits and alternatives for the proposed anesthesia with the patient or authorized representative who has indicated his/her understanding and acceptance.       Plan Discussed with: CRNA  Anesthesia Plan Comments:        Anesthesia Quick Evaluation

## 2021-09-03 NOTE — H&P (Signed)
[facility] Pre-Procedure H&P   Patient ID: Debra Gutierrez is a 87 y.o. female.  Gastroenterology Provider: Jaynie Collins, DO  Referring Provider: Jacob Moores, PA PCP: Patrice Paradise, MD  Date: 09/03/2021  HPI Debra Gutierrez is a 87 y.o. female who presents today for Colonoscopy for painless hematochezia, abnormal CT with possible avm.  Presented to [facility] ED with painless hematochezia. Hgb was 14 on presentation. Repeat was 12.3. A CTA raised concern for AVM in the sigmoid colon. She does was taking ibuprofen at home, but has since stopped.  No other acute gi complaints- denies constipation/diarrhea, melena.  Never undergone colonoscopy. No fhx crc or colon polyps.  Past Medical History:  Diagnosis Date   Arthritis    Hypertension     Past Surgical History:  Procedure Laterality Date   BREAST LUMPECTOMY     KNEE SURGERY      Family History No h/o GI disease or malignancy  Review of Systems  Constitutional:  Negative for activity change, appetite change, fatigue, fever and unexpected weight change.  HENT:  Negative for trouble swallowing and voice change.   Respiratory:  Negative for shortness of breath and wheezing.   Cardiovascular:  Negative for chest pain and palpitations.  Gastrointestinal:  Positive for blood in stool. Negative for abdominal distention, abdominal pain, anal bleeding, constipation, diarrhea, nausea, rectal pain and vomiting.  Musculoskeletal:  Negative for arthralgias and myalgias.  Skin:  Negative for color change and pallor.  Neurological:  Negative for dizziness, syncope and weakness.  Psychiatric/Behavioral:  Negative for confusion.   All other systems reviewed and are negative.   Medications No current facility-administered medications on file prior to encounter.   Current Outpatient Medications on File Prior to Encounter  Medication Sig Dispense Refill   amLODipine (NORVASC) 2.5 MG  tablet Take 2.5 mg by mouth daily.     lisinopril (PRINIVIL,ZESTRIL) 20 MG tablet Take 20 mg by mouth daily.     Apple Cider Vinegar 300 MG TABS Take 900 mg by mouth.     Cholecalciferol 25 MCG (1000 UT) tablet Take 1,000 Units by mouth daily.     Cod Liver Oil 5000-500 UNIT/5ML OIL Take 1 Dose by mouth daily.     Coenzyme Q10 (CO Q 10) 10 MG CAPS Take 10 mg by mouth daily.     DOCOSAHEXAENOIC ACID PO Take 1 tablet by mouth daily.     NON FORMULARY Take 1 Dose by mouth daily.     Red Yeast Rice 600 MG CAPS Take 600 mg by mouth.      Pertinent medications related to GI and procedure were reviewed by me with the patient prior to the procedure   Current Facility-Administered Medications:    0.9 %  sodium chloride infusion, , Intravenous, Continuous, Jaynie Collins, DO  sodium chloride         Allergies  Allergen Reactions   Daypro [Oxaprozin] Tinitus   Penicillins Hives   Allergies were reviewed by me prior to the procedure  Objective    Vitals:   09/03/21 1146  BP: (!) 166/88  Pulse: 70  Resp: 16  Temp: (!) 96.1 F (35.6 C)  TempSrc: Temporal  SpO2: 100%  Weight: 63 kg  Height: 4\' 2"  (1.27 m)     Physical Exam Vitals reviewed.  Constitutional:      General: She is not in acute distress.    Appearance: Normal appearance. She is not ill-appearing, toxic-appearing or diaphoretic.  HENT:  Head: Normocephalic and atraumatic.     Nose: Nose normal.     Mouth/Throat:     Mouth: Mucous membranes are moist.     Pharynx: Oropharynx is clear.  Eyes:     General: No scleral icterus.    Extraocular Movements: Extraocular movements intact.  Cardiovascular:     Rate and Rhythm: Normal rate and regular rhythm.     Heart sounds: Normal heart sounds. No murmur heard.   No friction rub. No gallop.  Pulmonary:     Effort: Pulmonary effort is normal. No respiratory distress.     Breath sounds: Normal breath sounds. No wheezing, rhonchi or rales.  Abdominal:      General: Bowel sounds are normal. There is no distension.     Palpations: Abdomen is soft.     Tenderness: There is no abdominal tenderness. There is no guarding or rebound.  Musculoskeletal:     Cervical back: Neck supple.     Right lower leg: No edema.     Left lower leg: No edema.  Skin:    General: Skin is warm and dry.     Coloration: Skin is not jaundiced or pale.  Neurological:     General: No focal deficit present.     Mental Status: She is alert and oriented to person, place, and time. Mental status is at baseline.  Psychiatric:        Mood and Affect: Mood normal.        Behavior: Behavior normal.        Thought Content: Thought content normal.        Judgment: Judgment normal.     Assessment:  Ms. Debra Gutierrez is a 87 y.o. female  who presents today for Colonoscopy for painless hematochezia, abnormal CT with possible avm.  Plan:  Colonoscopy with possible intervention today  Colonoscopy with possible biopsy, control of bleeding, polypectomy, and interventions as necessary has been discussed with the patient/patient representative. Informed consent was obtained from the patient/patient representative after explaining the indication, nature, and risks of the procedure including but not limited to death, bleeding, perforation, missed neoplasm/lesions, cardiorespiratory compromise, and reaction to medications. Opportunity for questions was given and appropriate answers were provided. Patient/patient representative has verbalized understanding is amenable to undergoing the procedure.   Jaynie Collins, DO  [facility]  Portions of the record may have been created with voice recognition software. Occasional wrong-word or 'sound-a-like' substitutions may have occurred due to the inherent limitations of voice recognition software.  Read the chart carefully and recognize, using context, where substitutions may have occurred.

## 2021-09-03 NOTE — Transfer of Care (Signed)
Immediate Anesthesia Transfer of Care Note  Patient: Debra Gutierrez  Procedure(s) Performed: COLONOSCOPY  Patient Location: PACU  Anesthesia Type:General  Level of Consciousness: awake and sedated  Airway & Oxygen Therapy: Patient Spontanous Breathing and Patient connected to nasal cannula oxygen  Post-op Assessment: Report given to RN and Post -op Vital signs reviewed and stable  Post vital signs: Reviewed and stable  Last Vitals:  Vitals Value Taken Time  BP    Temp    Pulse    Resp    SpO2      Last Pain:  Vitals:   09/03/21 1146  TempSrc: Temporal  PainSc: 0-No pain         Complications: No notable events documented.

## 2021-09-03 NOTE — Anesthesia Postprocedure Evaluation (Signed)
Anesthesia Post Note  Patient: Debra Gutierrez  Procedure(s) Performed: COLONOSCOPY  Patient location during evaluation: PACU Anesthesia Type: General Level of consciousness: awake and alert, oriented and patient cooperative Pain management: pain level controlled Vital Signs Assessment: post-procedure vital signs reviewed and stable Respiratory status: spontaneous breathing, nonlabored ventilation and respiratory function stable Cardiovascular status: blood pressure returned to baseline and stable Postop Assessment: adequate PO intake Anesthetic complications: no   No notable events documented.   Last Vitals:  Vitals:   09/03/21 1303 09/03/21 1318  BP: (!) 117/59 (!) 140/53  Pulse:    Resp:    Temp:    SpO2:      Last Pain:  Vitals:   09/03/21 1318  TempSrc:   PainSc: 0-No pain                 Reed Breech

## 2021-09-04 ENCOUNTER — Encounter: Payer: Self-pay | Admitting: Gastroenterology

## 2021-09-10 DIAGNOSIS — I491 Atrial premature depolarization: Secondary | ICD-10-CM | POA: Diagnosis not present

## 2021-09-10 DIAGNOSIS — I499 Cardiac arrhythmia, unspecified: Secondary | ICD-10-CM | POA: Diagnosis not present

## 2021-10-01 DIAGNOSIS — I499 Cardiac arrhythmia, unspecified: Secondary | ICD-10-CM | POA: Diagnosis not present

## 2021-10-01 DIAGNOSIS — I491 Atrial premature depolarization: Secondary | ICD-10-CM | POA: Diagnosis not present

## 2021-10-03 DIAGNOSIS — I499 Cardiac arrhythmia, unspecified: Secondary | ICD-10-CM | POA: Diagnosis not present

## 2021-10-03 DIAGNOSIS — I491 Atrial premature depolarization: Secondary | ICD-10-CM | POA: Diagnosis not present

## 2021-10-10 DIAGNOSIS — I491 Atrial premature depolarization: Secondary | ICD-10-CM | POA: Insufficient documentation

## 2021-10-10 DIAGNOSIS — I10 Essential (primary) hypertension: Secondary | ICD-10-CM | POA: Diagnosis not present

## 2021-12-13 DIAGNOSIS — I491 Atrial premature depolarization: Secondary | ICD-10-CM | POA: Diagnosis not present

## 2021-12-13 DIAGNOSIS — M818 Other osteoporosis without current pathological fracture: Secondary | ICD-10-CM | POA: Diagnosis not present

## 2021-12-13 DIAGNOSIS — M159 Polyosteoarthritis, unspecified: Secondary | ICD-10-CM | POA: Diagnosis not present

## 2021-12-13 DIAGNOSIS — G500 Trigeminal neuralgia: Secondary | ICD-10-CM | POA: Diagnosis not present

## 2021-12-13 DIAGNOSIS — I10 Essential (primary) hypertension: Secondary | ICD-10-CM | POA: Diagnosis not present

## 2021-12-13 DIAGNOSIS — E538 Deficiency of other specified B group vitamins: Secondary | ICD-10-CM | POA: Diagnosis not present

## 2021-12-13 DIAGNOSIS — E782 Mixed hyperlipidemia: Secondary | ICD-10-CM | POA: Diagnosis not present

## 2021-12-13 DIAGNOSIS — R7303 Prediabetes: Secondary | ICD-10-CM | POA: Diagnosis not present

## 2022-01-09 DIAGNOSIS — I10 Essential (primary) hypertension: Secondary | ICD-10-CM | POA: Diagnosis not present

## 2022-01-09 DIAGNOSIS — Z03818 Encounter for observation for suspected exposure to other biological agents ruled out: Secondary | ICD-10-CM | POA: Diagnosis not present

## 2022-01-09 DIAGNOSIS — I491 Atrial premature depolarization: Secondary | ICD-10-CM | POA: Diagnosis not present

## 2022-01-15 DIAGNOSIS — R0781 Pleurodynia: Secondary | ICD-10-CM | POA: Diagnosis not present

## 2022-01-15 DIAGNOSIS — Z8616 Personal history of COVID-19: Secondary | ICD-10-CM | POA: Diagnosis not present

## 2022-03-12 DIAGNOSIS — U071 COVID-19: Secondary | ICD-10-CM | POA: Diagnosis not present

## 2022-03-12 DIAGNOSIS — Z03818 Encounter for observation for suspected exposure to other biological agents ruled out: Secondary | ICD-10-CM | POA: Diagnosis not present

## 2022-03-16 ENCOUNTER — Emergency Department
Admission: EM | Admit: 2022-03-16 | Discharge: 2022-03-16 | Disposition: A | Discharge status: Home / Self Care | Payer: PPO | Attending: Emergency Medicine | Admitting: Emergency Medicine

## 2022-03-16 ENCOUNTER — Other Ambulatory Visit: Payer: Self-pay

## 2022-03-16 ENCOUNTER — Emergency Department: Payer: PPO

## 2022-03-16 DIAGNOSIS — K648 Other hemorrhoids: Secondary | ICD-10-CM

## 2022-03-16 DIAGNOSIS — I10 Essential (primary) hypertension: Secondary | ICD-10-CM | POA: Diagnosis not present

## 2022-03-16 DIAGNOSIS — U071 COVID-19: Secondary | ICD-10-CM | POA: Diagnosis not present

## 2022-03-16 DIAGNOSIS — K922 Gastrointestinal hemorrhage, unspecified: Secondary | ICD-10-CM

## 2022-03-16 DIAGNOSIS — D72829 Elevated white blood cell count, unspecified: Secondary | ICD-10-CM | POA: Insufficient documentation

## 2022-03-16 DIAGNOSIS — K625 Hemorrhage of anus and rectum: Secondary | ICD-10-CM | POA: Diagnosis not present

## 2022-03-16 DIAGNOSIS — I700 Atherosclerosis of aorta: Secondary | ICD-10-CM

## 2022-03-16 DIAGNOSIS — R7989 Other specified abnormal findings of blood chemistry: Secondary | ICD-10-CM | POA: Insufficient documentation

## 2022-03-16 DIAGNOSIS — K7689 Other specified diseases of liver: Secondary | ICD-10-CM | POA: Diagnosis not present

## 2022-03-16 DIAGNOSIS — R059 Cough, unspecified: Secondary | ICD-10-CM | POA: Diagnosis present

## 2022-03-16 DIAGNOSIS — K8080 Other cholelithiasis without obstruction: Secondary | ICD-10-CM

## 2022-03-16 LAB — CBC
HCT: 43 % (ref 36.0–46.0)
Hemoglobin: 13.9 g/dL (ref 12.0–15.0)
MCH: 31.8 pg (ref 26.0–34.0)
MCHC: 32.3 g/dL (ref 30.0–36.0)
MCV: 98.4 fL (ref 80.0–100.0)
Platelets: 203 10*3/uL (ref 150–400)
RBC: 4.37 MIL/uL (ref 3.87–5.11)
RDW: 12.4 % (ref 11.5–15.5)
WBC: 3.1 10*3/uL — ABNORMAL LOW (ref 4.0–10.5)
nRBC: 0 % (ref 0.0–0.2)

## 2022-03-16 LAB — COMPREHENSIVE METABOLIC PANEL
ALT: 18 U/L (ref 0–44)
AST: 22 U/L (ref 15–41)
Albumin: 4.1 g/dL (ref 3.5–5.0)
Alkaline Phosphatase: 83 U/L (ref 38–126)
Anion gap: 7 (ref 5–15)
BUN: 14 mg/dL (ref 8–23)
CO2: 26 mmol/L (ref 22–32)
Calcium: 8.9 mg/dL (ref 8.9–10.3)
Chloride: 106 mmol/L (ref 98–111)
Creatinine, Ser: 0.76 mg/dL (ref 0.44–1.00)
GFR, Estimated: >60 mL/min (ref >60)
Glucose, Bld: 127 mg/dL — ABNORMAL HIGH (ref 70–99)
Potassium: 4 mmol/L (ref 3.5–5.1)
Sodium: 139 mmol/L (ref 135–145)
Total Bilirubin: 0.3 mg/dL (ref 0.3–1.2)
Total Protein: 7.2 g/dL (ref 6.5–8.1)

## 2022-03-16 LAB — TYPE AND SCREEN
ABO/RH(D): O POS
Antibody Screen: NEGATIVE

## 2022-03-16 MED ORDER — IOHEXOL 350 MG/ML SOLN
75.0000 mL | Freq: Once | INTRAVENOUS | Status: AC | PRN
  Administered 2022-03-16: 75 mL via INTRAVENOUS

## 2022-03-16 NOTE — ED Provider Notes (Signed)
? ?[facility] ?Provider Note ? ? ? Event Date/Time  ? First MD Initiated Contact with Patient 03/16/22 1853   ?  (approximate) ? ? ?History  ? ?Rectal Bleeding ? ? ?HPI ? ?Debra Gutierrez is a 88 y.o. female with a past medical history of HTN, PACs with aberrant conduction, osteoarthritis, diverticulosis, and previous ED evaluation and October of last year for hematochezia with subsequent colonoscopy showing internal hemorrhoids as well as recent diagnosis of COVID on 4/11 who presents for evaluation of right red blood per rectum.  States he has had a couple episodes today and they are all painless.  He thinks he had some looser stools when she was initially diagnosed with COVID on 4/11 but otherwise no significant diarrhea.  She denies any back pain, Donnell pain, rectal pain or any urinary symptoms.  States the only symptom from her COVID at this time is a mild cough.  No worsening today, chest pain, shortness of breath, headache, earache, sore throat, vomiting or rash.  She is not on any blood thinners or NSAIDs.  She denies any EtOH use.  No known history of liver disease. ? ?  ? ? ?Physical Exam  ?Triage Vital Signs: ?ED Triage Vitals  ?Enc Vitals Group  ?   BP 03/16/22 1848 (!) 168/106  ?   Pulse Rate 03/16/22 1848 82  ?   Resp 03/16/22 1848 19  ?   Temp 03/16/22 1848 97.6 ?F (36.4 ?C)  ?   Temp Source 03/16/22 1848 Oral  ?   SpO2 03/16/22 1848 97 %  ?   Weight --   ?   Height 03/16/22 1849 5\' 3"  (1.6 m)  ?   Head Circumference --   ?   Peak Flow --   ?   Pain Score 03/16/22 1849 0  ?   Pain Loc --   ?   Pain Edu? --   ?   Excl. in GC? --   ? ? ?Most recent vital signs: ?Vitals:  ? 03/16/22 1848  ?BP: (!) 168/106  ?Pulse: 82  ?Resp: 19  ?Temp: 97.6 ?F (36.4 ?C)  ?SpO2: 97%  ? ? ?General: Awake, no distress.  ?CV:  Good peripheral perfusion.  2+ radial pulses. ?Resp:  Normal effort.  Clear bilaterally. ?Abd:  No distention.  Soft throughout. ?Other:  On rectal exam I am able to  palpate and see an internal hemorrhoid that seems to be recently bleeding consider small blood clot in place.  No active bleeding.  No fissures.  Small nonbleeding external hemorrhoid. ? ? ?ED Results / Procedures / Treatments  ?Labs ?(all labs ordered are listed, but only abnormal results are displayed) ?Labs Reviewed  ?CBC - Abnormal; Notable for the following components:  ?    Result Value  ? WBC 3.1 (*)   ? All other components within normal limits  ?COMPREHENSIVE METABOLIC PANEL  ?POC OCCULT BLOOD, ED  ?TYPE AND SCREEN  ? ? ? ?EKG ? ? ?RADIOLOGY ? ?CTA abdomen pelvis on my review without evidence of active bleeding, diverticulitis, evidence of an AVM or other clear acute process.  I also reviewed radiology interpretation and agree with the findings of hepatic cyst, cholelithiasis without cholecystitis, diverticulosis without diverticulitis and aortic atherosclerosis without any other acute process.  ? ? ?PROCEDURES: ? ?Critical Care performed: No ? ?.1-3 Lead EKG Interpretation ?Performed by: 03/18/22, MD ?Authorized by: Gilles Chiquito, MD  ? ?  Interpretation: normal   ?  ECG rate assessment: normal   ?  Rhythm: sinus rhythm   ?  Ectopy: none   ?  Conduction: normal   ? ?The patient is on the cardiac monitor to evaluate for evidence of arrhythmia and/or significant heart rate changes. ? ? ?MEDICATIONS ORDERED IN ED: ?Medications - No data to display ? ? ?IMPRESSION / MDM / ASSESSMENT AND PLAN / ED COURSE  ?I reviewed the triage vital signs and the nursing notes. ?             ?               ? ?Differential diagnosis includes, but is not limited to recurrence of known bleeding internal hemorrhoid, bleeding diverticuli, bleeding AVM versus malignancy other source. ? ?Reviewed most recent colonoscopy from 10/3 that was noted to have inadequate prep but there was also findings of hemorrhoids on perianal exam and nonbleeding internal hemorrhoids as well as diverticulosis of the left colon. ? ?CMP without  any other significant electrolyte or metabolic derangements.  BUN is 14.  CBC with WBC count 3.1 otherwise hemoglobin of 13.9 and normal platelets ? ?CTA abdomen pelvis on my review without evidence of active bleeding, diverticulitis, evidence of an AVM or other clear acute process.  I also reviewed radiology interpretation and agree with the findings of hepatic cyst, cholelithiasis without cholecystitis, diverticulosis without diverticulitis and aortic atherosclerosis without any other acute process.  ? ?Suspect bleeding internal hemorrhoid.  Patient has not had any active bleeding here and denies any symptoms to suggest symptomatic anemia.  She has stable vitals and blood pressure slightly touch in the high side.  There is no active bleeding or CT or evidence of diverticulitis.  Discussed incidental findings on CT as well as high blood pressure with patient daughter at bedside.  Exam was concerning for recurrent bleeding from internal hemorrhoid.  Discussed that I think it is reasonable for patient to follow-up with GI given low suspicion for significant life-threatening hemorrhage at this point I think she stable to discharge to outpatient follow-up.  Daughter and patient are amenable with plan.  Discharged stable condition.  Strict return precautions advised and discussed. ?  ? ? ?FINAL CLINICAL IMPRESSION(S) / ED DIAGNOSES  ? ?Final diagnoses:  ?Gastrointestinal hemorrhage, unspecified gastrointestinal hemorrhage type  ?COVID  ?Hypertension, unspecified type  ? ? ? ?Rx / DC Orders  ? ?ED Discharge Orders   ? ? None  ? ?  ? ? ? ?Note:  This document was prepared using Dragon voice recognition software and may include unintentional dictation errors. ?  ?Gilles Chiquito, MD ?03/16/22 2153 ? ?

## 2022-03-16 NOTE — Discharge Instructions (Addendum)
IMPRESSION: ?1. Low-attenuation liver lesions which may correspond to the hepatic ?cyst seen on the prior abdominal ultrasound. ?2. Cholelithiasis. ?3. Colonic diverticulosis. ?4. Aortic atherosclerosis without evidence of aneurysmal dilatation, ?dissection, major vessel occlusion or hemodynamically significant ?stenosis of the arterial structures within the abdomen and pelvis. ?  ?Aortic Atherosclerosis (ICD10-I70.0). ?

## 2022-03-16 NOTE — ED Notes (Signed)
Pt discharge information reviewed. Pt understands need for follow up care and when to return if symptoms worsen. All questions answered. Pt is alert and oriented with even and regular respirations. Pt is seen ambulating out of department with string steady gait with family.  °

## 2022-03-16 NOTE — ED Triage Notes (Signed)
Patient to ER via Pov with complaints of rectal bleeding that started this morning. Reports bright red bleeding, five episodes. States it was mixed in her stool and after wiping. Denies blood thinner usage.  ? ?States the same happened last year. Has a hemorrhoid. Was seen by GI at [facility].  ? ?Patient tested positive for covid on Tuesday, states she has been taking Molnupiravir.  ?

## 2022-04-02 DIAGNOSIS — M26652 Arthropathy of left temporomandibular joint: Secondary | ICD-10-CM | POA: Diagnosis not present

## 2022-04-20 ENCOUNTER — Other Ambulatory Visit: Payer: Self-pay

## 2022-04-20 ENCOUNTER — Encounter: Payer: Self-pay | Admitting: Emergency Medicine

## 2022-04-20 ENCOUNTER — Emergency Department
Admission: EM | Admit: 2022-04-20 | Discharge: 2022-04-20 | Disposition: A | Discharge status: Home / Self Care | Payer: PPO | Attending: Emergency Medicine | Admitting: Emergency Medicine

## 2022-04-20 DIAGNOSIS — K644 Residual hemorrhoidal skin tags: Secondary | ICD-10-CM | POA: Insufficient documentation

## 2022-04-20 DIAGNOSIS — K625 Hemorrhage of anus and rectum: Secondary | ICD-10-CM

## 2022-04-20 DIAGNOSIS — I10 Essential (primary) hypertension: Secondary | ICD-10-CM | POA: Insufficient documentation

## 2022-04-20 LAB — COMPREHENSIVE METABOLIC PANEL
ALT: 18 U/L (ref 0–44)
AST: 19 U/L (ref 15–41)
Albumin: 4.4 g/dL (ref 3.5–5.0)
Alkaline Phosphatase: 75 U/L (ref 38–126)
Anion gap: 6 (ref 5–15)
BUN: 17 mg/dL (ref 8–23)
CO2: 27 mmol/L (ref 22–32)
Calcium: 9.4 mg/dL (ref 8.9–10.3)
Chloride: 105 mmol/L (ref 98–111)
Creatinine, Ser: 0.79 mg/dL (ref 0.44–1.00)
GFR, Estimated: >60 mL/min (ref >60)
Glucose, Bld: 118 mg/dL — ABNORMAL HIGH (ref 70–99)
Potassium: 4.3 mmol/L (ref 3.5–5.1)
Sodium: 138 mmol/L (ref 135–145)
Total Bilirubin: 0.8 mg/dL (ref 0.3–1.2)
Total Protein: 7.5 g/dL (ref 6.5–8.1)

## 2022-04-20 LAB — CBC
HCT: 44 % (ref 36.0–46.0)
Hemoglobin: 14.4 g/dL (ref 12.0–15.0)
MCH: 32 pg (ref 26.0–34.0)
MCHC: 32.7 g/dL (ref 30.0–36.0)
MCV: 97.8 fL (ref 80.0–100.0)
Platelets: 222 10*3/uL (ref 150–400)
RBC: 4.5 MIL/uL (ref 3.87–5.11)
RDW: 12.6 % (ref 11.5–15.5)
WBC: 4.2 10*3/uL (ref 4.0–10.5)
nRBC: 0 % (ref 0.0–0.2)

## 2022-04-20 LAB — TYPE AND SCREEN
ABO/RH(D): O POS
Antibody Screen: NEGATIVE

## 2022-04-20 NOTE — ED Notes (Signed)
Pt to triage to re-draw labs

## 2022-04-20 NOTE — ED Provider Notes (Signed)
[facility] Provider Note    Event Date/Time   First MD Initiated Contact with Patient 04/20/22 1146     (approximate)   History   Rectal Bleeding   HPI  Debra Gutierrez is a 88 y.o. female with a past medical history of hypertension, PACs, osteoarthritis, diverticulosis, previous ER evaluations in April 2023 in October 2022 for hematochezia with subsequent colonoscopy revealing internal hemorrhoids who presents today for evaluation of bright red blood per rectum.  Patient reports that this has been intermittent throughout the week.  She reports that she has "a little more than a tablespoon" of bright red blood when she wipes.  She denies any diarrhea.  She denies pain in her rectum.  She denies abdominal pain.  She reports that the blood is mixed in with her stool.  She has not had any dizziness or orthostasis.  During her recent ER visit she had a CTA abdomen and pelvis which demonstrated no active bleeding, diverticulitis, or evidence of AVM.  Colonoscopy in October 2022 revealed internal and external hemorrhoids as well as diverticula.  There are no problems to display for this patient.         Physical Exam   Triage Vital Signs: ED Triage Vitals  Enc Vitals Group     BP 04/20/22 1048 125/81     Pulse Rate 04/20/22 1048 87     Resp 04/20/22 1048 16     Temp 04/20/22 1048 99 F (37.2 C)     Temp src --      SpO2 04/20/22 1048 95 %     Weight 04/20/22 1054 139 lb (63 kg)     Height 04/20/22 1054 5\' 3"  (1.6 m)     Head Circumference --      Peak Flow --      Pain Score 04/20/22 1053 0     Pain Loc --      Pain Edu? --      Excl. in GC? --     Most recent vital signs: Vitals:   04/20/22 1048 04/20/22 1230  BP: 125/81 130/70  Pulse: 87 75  Resp: 16 16  Temp: 99 F (37.2 C) 99 F (37.2 C)  SpO2: 95% 98%    Physical Exam Vitals and nursing note reviewed.  Constitutional:      General: Awake and alert. No acute distress.     Appearance: Normal appearance. She is well-developed and normal weight.  HENT:     Head: Normocephalic and atraumatic.     Mouth/Throat:     Mouth: Mucous membranes are moist.  Eyes:     General: PERRL. Normal EOMs        Right eye: No discharge.        Left eye: No discharge.     Conjunctiva/sclera: Conjunctivae normal.  Cardiovascular:     Rate and Rhythm: Normal rate and regular rhythm.     Pulses: Normal pulses.     Heart sounds: Normal heart sounds Pulmonary:     Effort: Pulmonary effort is normal. No respiratory distress.     Breath sounds: Normal breath sounds.  Abdominal:     Abdomen is soft. There is no abdominal tenderness. No rebound or guarding. No distention. Rectal Exam: External hemorrhoids noted, not thrombosed. Non tender. Brown stool in rectal vault, guaiac positive, no active bleeding Musculoskeletal:        General: No swelling. Normal range of motion.     Cervical back: Normal range  of motion and neck supple.  Skin:    General: Skin is warm and dry.     Capillary Refill: Capillary refill takes less than 2 seconds.     Findings: No rash.  Neurological:     Mental Status: She is alert.      ED Results / Procedures / Treatments   Labs (all labs ordered are listed, but only abnormal results are displayed) Labs Reviewed  COMPREHENSIVE METABOLIC PANEL - Abnormal; Notable for the following components:      Result Value   Glucose, Bld 118 (*)    All other components within normal limits  CBC  POC OCCULT BLOOD, ED  TYPE AND SCREEN     EKG     RADIOLOGY     PROCEDURES:  Critical Care performed:   Procedures   MEDICATIONS ORDERED IN ED: Medications - No data to display   IMPRESSION / MDM / ASSESSMENT AND PLAN / ED COURSE  I reviewed the triage vital signs and the nursing notes.   Differential diagnosis includes, but is not limited to, internal hemorrhoids, external hemorrhoids, diverticular bleeding, less likely AVM, upper GI bleed.   Patient presents emergency department awake and alert, hemodynamically stable and afebrile.  She has no abdominal tenderness.  She has a recent CTA of her abdomen and pelvis last month when she presented with the same complaint which revealed no active bleeding or AVM.  She had a colonoscopy in October which revealed internal and external hemorrhoids.  I suspect bleeding from her known hemorrhoids.  She has nonthrombosed external hemorrhoids on exam, no tenderness with digital rectal exam, brown stool that is guaiac positive.  H&H is stable, actually improved from prior (14.4/4.5 as compared to 13.9/4.37) and without any abdominal tenderness or symptomatic bleeding, do not feel that re-imaging would yield significant benefit.  Patient is comfortable with this.  We discussed very strict return precautions and outpatient follow-up with GI.  Patient reports that she already has an appointment scheduled with GI.  Patient was discussed with Dr. Vicente Males who agrees with assessment and plan.     FINAL CLINICAL IMPRESSION(S) / ED DIAGNOSES   Final diagnoses:  Rectal bleeding     Rx / DC Orders   ED Discharge Orders     None        Note:  This document was prepared using Dragon voice recognition software and may include unintentional dictation errors.   Keturah Shavers 04/20/22 1908    Merwyn Katos, MD 04/20/22 [phone]

## 2022-04-20 NOTE — Discharge Instructions (Signed)
Your blood work is normal.  Please follow-up with GI.  Please return the emergency department for worsening bleeding, blood clots, abdominal pain, dizziness, lightheadedness, fevers, or any other concerns.  It was a pleasure caring for you today.

## 2022-04-20 NOTE — ED Triage Notes (Signed)
Pt presents to ED with c/o rectal bleeding for the past several days. Pt states she has hemorrhoids and has bled from them intermittently "for a while"; has been seen by her pcp for the same. Pt states she has noticed an increase in bleeding the past couple of days and just wants to see if there is anything that can be done about it and to make sure that the "blood is coming from the hemorrhoid and not somewhere else". Pt denies pain or other symptoms.

## 2022-04-24 ENCOUNTER — Other Ambulatory Visit: Payer: Self-pay | Admitting: Physician Assistant

## 2022-04-24 DIAGNOSIS — R0789 Other chest pain: Secondary | ICD-10-CM | POA: Diagnosis not present

## 2022-04-24 DIAGNOSIS — R1011 Right upper quadrant pain: Secondary | ICD-10-CM | POA: Diagnosis not present

## 2022-04-25 ENCOUNTER — Encounter
Admission: EM | Disposition: A | Discharge status: Home / Self Care | Payer: Self-pay | Source: Home / Self Care | Attending: Obstetrics and Gynecology

## 2022-04-25 ENCOUNTER — Inpatient Hospital Stay
Admission: RE | Admit: 2022-04-25 | Discharge: 2022-04-25 | Disposition: A | Discharge status: Home / Self Care | Payer: PPO | Source: Ambulatory Visit | Attending: Physician Assistant | Admitting: Physician Assistant

## 2022-04-25 ENCOUNTER — Inpatient Hospital Stay: Payer: PPO

## 2022-04-25 ENCOUNTER — Inpatient Hospital Stay: Payer: PPO | Admitting: Certified Registered"

## 2022-04-25 ENCOUNTER — Encounter: Payer: Self-pay | Admitting: Intensive Care

## 2022-04-25 ENCOUNTER — Other Ambulatory Visit: Payer: Self-pay

## 2022-04-25 ENCOUNTER — Inpatient Hospital Stay
Admission: EM | Admit: 2022-04-25 | Discharge: 2022-05-01 | DRG: 417 | Disposition: A | Discharge status: Home / Self Care | Payer: PPO | Attending: Internal Medicine | Admitting: Internal Medicine

## 2022-04-25 DIAGNOSIS — Z79899 Other long term (current) drug therapy: Secondary | ICD-10-CM

## 2022-04-25 DIAGNOSIS — K8066 Calculus of gallbladder and bile duct with acute and chronic cholecystitis without obstruction: Principal | ICD-10-CM | POA: Diagnosis present

## 2022-04-25 DIAGNOSIS — Z01818 Encounter for other preprocedural examination: Secondary | ICD-10-CM | POA: Diagnosis not present

## 2022-04-25 DIAGNOSIS — R7303 Prediabetes: Secondary | ICD-10-CM | POA: Diagnosis present

## 2022-04-25 DIAGNOSIS — K812 Acute cholecystitis with chronic cholecystitis: Secondary | ICD-10-CM | POA: Diagnosis not present

## 2022-04-25 DIAGNOSIS — K7689 Other specified diseases of liver: Secondary | ICD-10-CM | POA: Diagnosis not present

## 2022-04-25 DIAGNOSIS — Z880 Allergy status to penicillin: Secondary | ICD-10-CM | POA: Diagnosis not present

## 2022-04-25 DIAGNOSIS — R1011 Right upper quadrant pain: Secondary | ICD-10-CM | POA: Insufficient documentation

## 2022-04-25 DIAGNOSIS — Y848 Other medical procedures as the cause of abnormal reaction of the patient, or of later complication, without mention of misadventure at the time of the procedure: Secondary | ICD-10-CM | POA: Diagnosis not present

## 2022-04-25 DIAGNOSIS — K432 Incisional hernia without obstruction or gangrene: Secondary | ICD-10-CM

## 2022-04-25 DIAGNOSIS — K9189 Other postprocedural complications and disorders of digestive system: Secondary | ICD-10-CM | POA: Diagnosis not present

## 2022-04-25 DIAGNOSIS — K859 Acute pancreatitis without necrosis or infection, unspecified: Secondary | ICD-10-CM | POA: Diagnosis not present

## 2022-04-25 DIAGNOSIS — K760 Fatty (change of) liver, not elsewhere classified: Secondary | ICD-10-CM | POA: Diagnosis present

## 2022-04-25 DIAGNOSIS — I10 Essential (primary) hypertension: Secondary | ICD-10-CM | POA: Diagnosis present

## 2022-04-25 DIAGNOSIS — Z888 Allergy status to other drugs, medicaments and biological substances status: Secondary | ICD-10-CM | POA: Diagnosis not present

## 2022-04-25 DIAGNOSIS — R109 Unspecified abdominal pain: Secondary | ICD-10-CM | POA: Diagnosis not present

## 2022-04-25 DIAGNOSIS — R0789 Other chest pain: Secondary | ICD-10-CM | POA: Insufficient documentation

## 2022-04-25 DIAGNOSIS — K430 Incisional hernia with obstruction, without gangrene: Secondary | ICD-10-CM | POA: Diagnosis present

## 2022-04-25 DIAGNOSIS — K8020 Calculus of gallbladder without cholecystitis without obstruction: Secondary | ICD-10-CM | POA: Diagnosis not present

## 2022-04-25 DIAGNOSIS — K838 Other specified diseases of biliary tract: Secondary | ICD-10-CM | POA: Diagnosis not present

## 2022-04-25 DIAGNOSIS — K819 Cholecystitis, unspecified: Secondary | ICD-10-CM | POA: Diagnosis not present

## 2022-04-25 DIAGNOSIS — K8070 Calculus of gallbladder and bile duct without cholecystitis without obstruction: Secondary | ICD-10-CM | POA: Diagnosis not present

## 2022-04-25 DIAGNOSIS — K8064 Calculus of gallbladder and bile duct with chronic cholecystitis without obstruction: Secondary | ICD-10-CM | POA: Diagnosis not present

## 2022-04-25 DIAGNOSIS — K5790 Diverticulosis of intestine, part unspecified, without perforation or abscess without bleeding: Secondary | ICD-10-CM | POA: Diagnosis not present

## 2022-04-25 DIAGNOSIS — K828 Other specified diseases of gallbladder: Secondary | ICD-10-CM | POA: Diagnosis not present

## 2022-04-25 DIAGNOSIS — K810 Acute cholecystitis: Secondary | ICD-10-CM | POA: Diagnosis not present

## 2022-04-25 HISTORY — DX: Cardiac arrhythmia, unspecified: I49.9

## 2022-04-25 HISTORY — PX: ENDOSCOPIC RETROGRADE CHOLANGIOPANCREATOGRAPHY (ERCP) WITH PROPOFOL: SHX5810

## 2022-04-25 LAB — CBC
HCT: 42.3 % (ref 36.0–46.0)
Hemoglobin: 13.9 g/dL (ref 12.0–15.0)
MCH: 32 pg (ref 26.0–34.0)
MCHC: 32.9 g/dL (ref 30.0–36.0)
MCV: 97.5 fL (ref 80.0–100.0)
Platelets: 223 10*3/uL (ref 150–400)
RBC: 4.34 MIL/uL (ref 3.87–5.11)
RDW: 12.6 % (ref 11.5–15.5)
WBC: 4.7 10*3/uL (ref 4.0–10.5)
nRBC: 0 % (ref 0.0–0.2)

## 2022-04-25 LAB — COMPREHENSIVE METABOLIC PANEL
ALT: 307 U/L — ABNORMAL HIGH (ref 0–44)
AST: 290 U/L — ABNORMAL HIGH (ref 15–41)
Albumin: 4.4 g/dL (ref 3.5–5.0)
Alkaline Phosphatase: 124 U/L (ref 38–126)
Anion gap: 7 (ref 5–15)
BUN: 13 mg/dL (ref 8–23)
CO2: 28 mmol/L (ref 22–32)
Calcium: 9.1 mg/dL (ref 8.9–10.3)
Chloride: 105 mmol/L (ref 98–111)
Creatinine, Ser: 1 mg/dL (ref 0.44–1.00)
GFR, Estimated: 54 mL/min — ABNORMAL LOW (ref >60)
Glucose, Bld: 124 mg/dL — ABNORMAL HIGH (ref 70–99)
Potassium: 3.7 mmol/L (ref 3.5–5.1)
Sodium: 140 mmol/L (ref 135–145)
Total Bilirubin: 1.3 mg/dL — ABNORMAL HIGH (ref 0.3–1.2)
Total Protein: 7.5 g/dL (ref 6.5–8.1)

## 2022-04-25 LAB — URINALYSIS, ROUTINE W REFLEX MICROSCOPIC
Bilirubin Urine: NEGATIVE
Glucose, UA: NEGATIVE mg/dL
Hgb urine dipstick: NEGATIVE
Ketones, ur: NEGATIVE mg/dL
Leukocytes,Ua: NEGATIVE
Nitrite: NEGATIVE
Protein, ur: NEGATIVE mg/dL
Specific Gravity, Urine: 1.009 (ref 1.005–1.030)
pH: 7 (ref 5.0–8.0)

## 2022-04-25 LAB — LIPASE, BLOOD: Lipase: 27 U/L (ref 11–51)

## 2022-04-25 SURGERY — ENDOSCOPIC RETROGRADE CHOLANGIOPANCREATOGRAPHY (ERCP) WITH PROPOFOL
Anesthesia: General

## 2022-04-25 MED ORDER — MORPHINE SULFATE (PF) 2 MG/ML IV SOLN: 2.0000 mg | INTRAVENOUS | Status: DC | PRN

## 2022-04-25 MED ORDER — FENTANYL CITRATE PF 50 MCG/ML IJ SOSY: 25.0000 ug | PREFILLED_SYRINGE | INTRAMUSCULAR | Status: DC | PRN

## 2022-04-25 MED ORDER — OXYCODONE HCL 5 MG PO TABS: 5.0000 mg | ORAL_TABLET | Freq: Once | ORAL | Status: DC | PRN

## 2022-04-25 MED ORDER — SUCCINYLCHOLINE CHLORIDE 200 MG/10ML IV SOSY
PREFILLED_SYRINGE | INTRAVENOUS | Status: DC | PRN
  Administered 2022-04-25: 80 mg via INTRAVENOUS

## 2022-04-25 MED ORDER — SODIUM CHLORIDE 0.9 % IV SOLN: INTRAVENOUS | Status: AC

## 2022-04-25 MED ORDER — ONDANSETRON HCL 4 MG PO TABS: 4.0000 mg | ORAL_TABLET | Freq: Four times a day (QID) | ORAL | Status: DC | PRN

## 2022-04-25 MED ORDER — ONDANSETRON HCL 4 MG/2ML IJ SOLN
4.0000 mg | Freq: Four times a day (QID) | INTRAMUSCULAR | Status: DC | PRN
Start: 2022-04-25 — End: 2022-05-01

## 2022-04-25 MED ORDER — VITAMIN D 25 MCG (1000 UNIT) PO TABS
1000.0000 [IU] | ORAL_TABLET | Freq: Every day | ORAL | Status: DC
  Administered 2022-04-26 – 2022-05-01 (×5): 1000 [IU] via ORAL
  Filled 2022-04-25 (×5): qty 1

## 2022-04-25 MED ORDER — PROPOFOL 10 MG/ML IV BOLUS
INTRAVENOUS | Status: AC
  Filled 2022-04-25: qty 20

## 2022-04-25 MED ORDER — PHENYLEPHRINE HCL-NACL 20-0.9 MG/250ML-% IV SOLN
INTRAVENOUS | Status: AC
  Filled 2022-04-25: qty 250

## 2022-04-25 MED ORDER — LISINOPRIL 20 MG PO TABS
20.0000 mg | ORAL_TABLET | Freq: Every day | ORAL | Status: DC
  Administered 2022-04-25 – 2022-05-01 (×6): 20 mg via ORAL
  Filled 2022-04-25 (×6): qty 1

## 2022-04-25 MED ORDER — SUCCINYLCHOLINE CHLORIDE 200 MG/10ML IV SOSY
PREFILLED_SYRINGE | INTRAVENOUS | Status: AC
  Filled 2022-04-25: qty 10

## 2022-04-25 MED ORDER — INDOMETHACIN 50 MG RE SUPP
RECTAL | Status: AC
Start: 2022-04-25 — End: 2022-04-25
  Administered 2022-04-25: 100 mg
  Filled 2022-04-25: qty 2

## 2022-04-25 MED ORDER — LACTATED RINGERS IV SOLN
INTRAVENOUS | Status: DC
Start: 2022-04-25 — End: 2022-04-25

## 2022-04-25 MED ORDER — PROPOFOL 10 MG/ML IV BOLUS
INTRAVENOUS | Status: DC | PRN
  Administered 2022-04-25: 100 mg via INTRAVENOUS

## 2022-04-25 MED ORDER — OXYCODONE HCL 5 MG/5ML PO SOLN: 5.0000 mg | Freq: Once | ORAL | Status: DC | PRN

## 2022-04-25 MED ORDER — FENTANYL CITRATE (PF) 100 MCG/2ML IJ SOLN
INTRAMUSCULAR | Status: DC | PRN
  Administered 2022-04-25: 10 ug via INTRAVENOUS
  Administered 2022-04-25: 15 ug via INTRAVENOUS

## 2022-04-25 MED ORDER — AMLODIPINE BESYLATE 5 MG PO TABS
2.5000 mg | ORAL_TABLET | Freq: Every day | ORAL | Status: DC
  Administered 2022-04-26 – 2022-04-27 (×2): 2.5 mg via ORAL
  Filled 2022-04-25 (×2): qty 1

## 2022-04-25 MED ORDER — GADOBUTROL 1 MMOL/ML IV SOLN
5.0000 mL | Freq: Once | INTRAVENOUS | Status: AC | PRN
  Administered 2022-04-25: 5 mL via INTRAVENOUS

## 2022-04-25 MED ORDER — SODIUM CHLORIDE 0.9 % IV SOLN
2.0000 g | INTRAVENOUS | Status: DC
  Administered 2022-04-26 – 2022-04-29 (×4): 2 g via INTRAVENOUS
  Filled 2022-04-25 (×7): qty 20

## 2022-04-25 MED ORDER — LIDOCAINE HCL (CARDIAC) PF 100 MG/5ML IV SOSY
PREFILLED_SYRINGE | INTRAVENOUS | Status: DC | PRN
Start: 2022-04-25 — End: 2022-04-25
  Administered 2022-04-25: 100 mg via INTRAVENOUS

## 2022-04-25 MED ORDER — LIDOCAINE HCL (PF) 2 % IJ SOLN
INTRAMUSCULAR | Status: AC
  Filled 2022-04-25: qty 5

## 2022-04-25 MED ORDER — FENTANYL CITRATE (PF) 100 MCG/2ML IJ SOLN
INTRAMUSCULAR | Status: AC
  Filled 2022-04-25: qty 2

## 2022-04-25 MED ORDER — ONDANSETRON HCL 4 MG/2ML IJ SOLN
INTRAMUSCULAR | Status: DC | PRN
  Administered 2022-04-25: 4 mg via INTRAVENOUS

## 2022-04-25 NOTE — OR Nursing (Signed)
Pt repsonds but keeps eyes closed mostly. Rn spoke with dr Carole Binning. Orders d/c from recovery

## 2022-04-25 NOTE — Plan of Care (Signed)
  Problem: Clinical Measurements: Goal: Ability to maintain clinical measurements within normal limits will improve Outcome: Progressing Goal: Will remain free from infection Outcome: Progressing Goal: Diagnostic test results will improve Outcome: Progressing Goal: Cardiovascular complication will be avoided Outcome: Progressing   Problem: Elimination: Goal: Will not experience complications related to bowel motility Outcome: Progressing Goal: Will not experience complications related to urinary retention Outcome: Progressing   

## 2022-04-25 NOTE — Progress Notes (Signed)
Patient admitted to room 230 from ED for ERCP. Ambulated to bathroom with one stand by assist. Steady Gait. A+Ox4. VSS. Will continue to monitor and assess with POC.

## 2022-04-25 NOTE — Transfer of Care (Signed)
Immediate Anesthesia Transfer of Care Note  Patient: Debra Gutierrez  Procedure(s) Performed: ENDOSCOPIC RETROGRADE CHOLANGIOPANCREATOGRAPHY (ERCP) WITH PROPOFOL  Patient Location: PACU  Anesthesia Type:General  Level of Consciousness: awake, alert  and oriented  Airway & Oxygen Therapy: Patient Spontanous Breathing  Post-op Assessment: Report given to RN and Post -op Vital signs reviewed and stable  Post vital signs: Reviewed and stable  Last Vitals:  Vitals Value Taken Time  BP 179/73 04/25/22 1902  Temp 36.4 C 04/25/22 1901  Pulse 60 04/25/22 1905  Resp 16 04/25/22 1905  SpO2 100 % 04/25/22 1905  Vitals shown include unvalidated device data.  Last Pain:  Vitals:   04/25/22 1901  TempSrc: Temporal  PainSc: Asleep         Complications: No notable events documented.

## 2022-04-25 NOTE — ED Triage Notes (Signed)
Patient diagnosed with Choledocholithiasis this AM after [place] and told to come to ER. C/o abdominal pain that comes and goes

## 2022-04-25 NOTE — ED Notes (Signed)
Pt on cardiac monitor. Pt refused to be in gown so pt is in clothes. Pt family with her at bedside (son & daughter in law) IV in place. GCS 15.

## 2022-04-25 NOTE — H&P (Signed)
Midge Minium, MD [facility] [address] [place], [place] [address] Phone:[phone] Fax : [phone]  Primary Care Physician:  Patrice Paradise, MD Primary Gastroenterologist:  Dr. Servando Snare  Pre-Procedure History & Physical: HPI:  Debra Gutierrez is a 88 y.o. female is here for an ERCP.   Past Medical History:  Diagnosis Date   Arthritis    Hypertension    Irregular heart beat     Past Surgical History:  Procedure Laterality Date   ABDOMINAL HYSTERECTOMY     BREAST LUMPECTOMY     COLONOSCOPY N/A 09/03/2021   Procedure: COLONOSCOPY;  Surgeon: Jaynie Collins, DO;  Location: [facility] ENDOSCOPY;  Service: Gastroenterology;  Laterality: N/A;   KNEE SURGERY     thumb surgery     WRIST SURGERY      Prior to Admission medications   Medication Sig Start Date End Date Taking? Authorizing Provider  amLODipine (NORVASC) 2.5 MG tablet Take 2.5 mg by mouth daily.   Yes [provider]  Apple Cider Vinegar 300 MG TABS Take 900 mg by mouth.   Yes [provider]  Cholecalciferol 25 MCG (1000 UT) tablet Take 1,000 Units by mouth daily.   Yes [provider]  Coenzyme Q10 (CO Q 10) 10 MG CAPS Take 10 mg by mouth daily.   Yes [provider]  lisinopril (PRINIVIL,ZESTRIL) 20 MG tablet Take 20 mg by mouth daily. 04/16/17  Yes [provider]  metoprolol succinate (TOPROL-XL) 25 MG 24 hr tablet Take 25 mg by mouth daily.   Yes [provider]  Red Yeast Rice 600 MG CAPS Take 600 mg by mouth.   Yes [provider]  [facility] Liver Oil 5000-500 UNIT/5ML OIL Take 1 Dose by mouth daily. Patient not taking: Reported on 04/25/2022    [provider]  DOCOSAHEXAENOIC ACID PO Take 1 tablet by mouth daily.    [provider]  NON FORMULARY Take 1 Dose by mouth daily.    [provider]    Allergies as of 04/25/2022 - Review Complete 04/25/2022  Allergen Reaction Noted   Daypro [oxaprozin] Tinitus  07/05/2017   Penicillins Hives 07/05/2017    History reviewed. No pertinent family history.  Social History   Socioeconomic History   Marital status: Widowed    Spouse name: Not on file   Number of children: Not on file   Years of education: Not on file   Highest education level: Not on file  Occupational History   Not on file  Tobacco Use   Smoking status: Never   Smokeless tobacco: Never  Vaping Use   Vaping Use: Never used  Substance and Sexual Activity   Alcohol use: No   Drug use: No   Sexual activity: Never  Other Topics Concern   Not on file  Social History Narrative   Not on file   Social Determinants of Health   Financial Resource Strain: Not on file  Food Insecurity: Not on file  Transportation Needs: Not on file  Physical Activity: Not on file  Stress: Not on file  Social Connections: Not on file  Intimate Partner Violence: Not on file    Review of Systems: See HPI, otherwise negative ROS  Physical Exam: BP (!) 156/69 (BP Location: Left Arm)   Pulse 65   Temp 98.2 F (36.8 C) (Oral)   Resp 18   Ht 5\' 3"  (1.6 m)   Wt 59 kg   SpO2 98%   BMI 23.03  kg/m  General:   Alert,  pleasant and cooperative in NAD Head:  Normocephalic and atraumatic. Neck:  Supple; no masses or thyromegaly. Lungs:  Clear throughout to auscultation.    Heart:  Regular rate and rhythm. Abdomen:  Soft, nontender and nondistended. Normal bowel sounds, without guarding, and without rebound.   Neurologic:  Alert and  oriented x4;  grossly normal neurologically.  Impression/Plan: Debra Gutierrez is here for an ERCP to be performed for cbd stone  Risks, benefits, limitations, and alternatives regarding  ERCP have been reviewed with the patient.  Questions have been answered.  All parties agreeable.   Midge Minium, MD  04/25/2022, 5:12 PM

## 2022-04-25 NOTE — Anesthesia Procedure Notes (Signed)
Procedure Name: Intubation Date/Time: 04/25/2022 5:34 PM Performed by: Philbert Riser, CRNA Pre-anesthesia Checklist: Patient identified, Patient being monitored, Timeout performed, Emergency Drugs available and Suction available Patient Re-evaluated:Patient Re-evaluated prior to induction Oxygen Delivery Method: Circle system utilized Preoxygenation: Pre-oxygenation with 100% oxygen Induction Type: IV induction Laryngoscope Size: 3 and McGraph Grade View: Grade I Tube type: Oral Tube size: 6.5 mm Number of attempts: 1 Airway Equipment and Method: Stylet and Video-laryngoscopy Placement Confirmation: ETT inserted through vocal cords under direct vision, positive ETCO2 and breath sounds checked- equal and bilateral Secured at: 21 cm Tube secured with: Tape Dental Injury: Teeth and Oropharynx as per pre-operative assessment

## 2022-04-25 NOTE — Consult Note (Signed)
Debra Minium, MD [facility]  [address] [place], [place] [address] Phone: [phone] Fax : [phone]  Consultation  Referring Provider:     Dr. Fanny Bien Primary Care Physician:  Patrice Paradise, MD Primary Gastroenterologist:  Dr. Timothy Lasso         Reason for Consultation:     Dilated common bile duct  Date of Admission:  04/25/2022 Date of Consultation:  04/25/2022         HPI:   Debra Gutierrez is a 88 y.o. female Who came in today after having some abdominal pain. The patient reported abdominal pain that was in the right upper quadrant that lasted 1-2 hours with nausea.  The patient had an ultrasound done and was told to come to the emergency room. The patient was found to have gallstones with a dilated common bile duct at 10 mm and an increased bilirubin with normal alkaline phosphatase.  There was also mild gallbladder wall thickening and sludge.  The patient had no further abdominal pain or symptoms when she was seen by me today.  Past Medical History:  Diagnosis Date   Arthritis    Hypertension    Irregular heart beat     Past Surgical History:  Procedure Laterality Date   ABDOMINAL HYSTERECTOMY     BREAST LUMPECTOMY     COLONOSCOPY N/A 09/03/2021   Procedure: COLONOSCOPY;  Surgeon: Jaynie Collins, DO;  Location: [facility] ENDOSCOPY;  Service: Gastroenterology;  Laterality: N/A;   KNEE SURGERY     thumb surgery     WRIST SURGERY      Prior to Admission medications   Medication Sig Start Date End Date Taking? Authorizing Provider  amLODipine (NORVASC) 2.5 MG tablet Take 2.5 mg by mouth daily.   Yes [provider]  Apple Cider Vinegar 300 MG TABS Take 900 mg by mouth.   Yes [provider]  Cholecalciferol 25 MCG (1000 UT) tablet Take 1,000 Units by mouth daily.   Yes [provider]  Coenzyme Q10 (CO Q 10) 10 MG CAPS Take 10 mg by mouth daily.   Yes [provider]  lisinopril (PRINIVIL,ZESTRIL) 20 MG tablet Take 20  mg by mouth daily. 04/16/17  Yes [provider]  metoprolol succinate (TOPROL-XL) 25 MG 24 hr tablet Take 25 mg by mouth daily.   Yes [provider]  Red Yeast Rice 600 MG CAPS Take 600 mg by mouth.   Yes [provider]  [facility] Liver Oil 5000-500 UNIT/5ML OIL Take 1 Dose by mouth daily. Patient not taking: Reported on 04/25/2022    [provider]  DOCOSAHEXAENOIC ACID PO Take 1 tablet by mouth daily.    [provider]  NON FORMULARY Take 1 Dose by mouth daily.    [provider]    History reviewed. No pertinent family history.   Social History   Tobacco Use   Smoking status: Never   Smokeless tobacco: Never  Vaping Use   Vaping Use: Never used  Substance Use Topics   Alcohol use: No   Drug use: No    Allergies as of 04/25/2022 - Review Complete 04/25/2022  Allergen Reaction Noted   Daypro [oxaprozin] Tinitus 07/05/2017   Penicillins Hives 07/05/2017    Review of Systems:    All systems reviewed and negative except where noted in HPI.   Physical Exam:  Vital signs in last 24 hours: Temp:  [97.6 F (36.4 C)-98.4 F (36.9 C)] 97.7 F (36.5 C) (  05/25 2005) Pulse Rate:  [64-78] 64 (05/25 2005) Resp:  [16-20] 16 (05/25 2005) BP: (156-191)/(69-89) 163/75 (05/25 2005) SpO2:  [95 %-99 %] 99 % (05/25 2005) Weight:  [59 kg] 59 kg (05/25 1310) Last BM Date : 04/24/22 General:   Pleasant, cooperative in NAD Head:  Normocephalic and atraumatic. Eyes:   No icterus.   Conjunctiva pink. PERRLA. Ears:  Normal auditory acuity. Neck:  Supple; no masses or thyroidomegaly Lungs: Respirations even and unlabored. Lungs clear to auscultation bilaterally.   No wheezes, crackles, or rhonchi.  Heart:  Regular rate and rhythm;  Without murmur, clicks, rubs or gallops Abdomen:  Soft, nondistended, nontender. Normal bowel sounds. No appreciable masses or hepatomegaly.  No rebound or guarding.  Rectal:  Not performed. Msk:  Symmetrical  without gross deformities.    Extremities:  Without edema, cyanosis or clubbing. Neurologic:  Alert and oriented x3;  grossly normal neurologically. Skin:  Intact without significant lesions or rashes. Cervical Nodes:  No significant cervical adenopathy. Psych:  Alert and cooperative. Normal affect.  LAB RESULTS: Recent Labs    04/25/22 1311  WBC 4.7  HGB 13.9  HCT 42.3  PLT 223   BMET Recent Labs    04/25/22 1311  NA 140  K 3.7  CL 105  CO2 28  GLUCOSE 124*  BUN 13  CREATININE 1.00  CALCIUM 9.1   LFT Recent Labs    04/25/22 1311  PROT 7.5  ALBUMIN 4.4  AST 290*  ALT 307*  ALKPHOS 124  BILITOT 1.3*   PT/INR No results for input(s): LABPROT, INR in the last 72 hours.  STUDIES: DG C-Arm 1-60 Min-No Report  Result Date: 04/25/2022 Fluoroscopy was utilized by the requesting physician.  No radiographic interpretation.   US Abdomen Limited RUQ (LIVER/GB)  Result Date: 04/25/2022 CLINICAL DATA:  Abdominal pain EXAM: ULTRASOUND ABDOMEN LIMITED RIGHT UPPER QUADRANT COMPARISON:  Abdominal ultrasound dated September 24, 2019 FINDINGS: Gallbladder: No gallstones. Mild gallbladder wall thickening. Sludge is seen in the gallbladder. No sonographic Murphy sign noted by sonographer. Common bile duct: Diameter: 10.4 Liver: Simple appearing cyst of the left lobe of the liver measuring up to 4.3 cm and 1.7 cm. Heterogeneous echotexture with increased parenchymal echogenicity. Portal vein is patent on color Doppler imaging with normal direction of blood flow towards the liver. Other: None. IMPRESSION: 1. Dilated common bile duct which is concerning for choledocholithiasis. Recommend MRCP for further evaluation. 2. Mild gallbladder wall thickening and sludge with negative sonographic Murphy sign, findings are equivocal for acute cholecystitis. 3. Hepatic steatosis. Electronically Signed   By: Allegra LaiLeah  Strickland M.D.   On: 04/25/2022 09:17      Impression / Plan:   Assessment: Principal  Problem:   Cholelithiasis with choledocholithiasis Active Problems:   Hypertension   Debra Gutierrez is a 88 y.o. y/o female with who comes in with a dilated common bile duct and elevated liver enzymes suggestive of choledocholithiasis.  The patient's pain hasn't resolved and I was consult it for an ERCP.   Plan:  The patient was brought to the endoscopy unit for an ERCP 8 hours after she had eaten breakfast as per anesthesia protocol.  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  With manipulation the ampulla was cannulated untangled that would allow a wire passage into the pancreatic duct but not cannulation of the common bile duct. I explained the results to the patient's family and have contacted Dr. Everlene FarrierPabon about the  procedure.  The options are to do a cholecystectomy with intraoperative cholangiogram to see if there is a stone or the dilation is caused by the angulation of the bile duct due to the diverticulum.  Another option would be an MRCP to see if there is a stone in the come bile duct.  Other options would be as long as the patient is not in acute distress to attempt a repeat ERCP in a few days hoping that some of the swelling from the acute attack the patient had a right upper quadrant pain had gone down and the anatomy is more favorable to cannulate the common bile duct.  I will leave the decision to surgery at this time.  Thank you for involving me in the care of this patient.      LOS: 0 days   Debra Minium, MD, [facility] 04/25/2022, 8:09 PM,  Pager [phone] 7am-5pm  Check AMION for 5pm -7am coverage and on weekends   Note: This dictation was prepared with Dragon dictation along with smaller phrase technology. Any transcriptional errors that result from this process are unintentional.

## 2022-04-25 NOTE — Assessment & Plan Note (Signed)
Patient had a gallbladder ultrasound done for evaluation of right upper quadrant pain and noted to have choledocholithiasis. Labs show marked transaminitis and elevated total bilirubin levels GI has been consulted Patient is scheduled for ERCP today Consults surgery for possible cholecystectomy Supportive care with antiemetics, IV fluids and pain control

## 2022-04-25 NOTE — Anesthesia Preprocedure Evaluation (Signed)
Anesthesia Evaluation  Patient identified by MRN, date of birth, ID band Patient awake    Reviewed: Allergy & Precautions, NPO status , Patient's Chart, lab work & pertinent test results  History of Anesthesia Complications (+) Emergence Delirium and history of anesthetic complications  Airway Mallampati: III  TM Distance: <3 FB Neck ROM: full    Dental  (+) Chipped   Pulmonary neg pulmonary ROS, neg shortness of breath,    Pulmonary exam normal        Cardiovascular Exercise Tolerance: Good hypertension, Normal cardiovascular exam     Neuro/Psych negative neurological ROS  negative psych ROS   GI/Hepatic Neg liver ROS, GERD  ,  Endo/Other  negative endocrine ROS  Renal/GU      Musculoskeletal   Abdominal   Peds  Hematology negative hematology ROS (+)   Anesthesia Other Findings Past Medical History: No date: Arthritis No date: Hypertension No date: Irregular heart beat  Past Surgical History: No date: ABDOMINAL HYSTERECTOMY No date: BREAST LUMPECTOMY 09/03/2021: COLONOSCOPY; N/A     Comment:  Procedure: COLONOSCOPY;  Surgeon: Jaynie Collins,              DO;  Location: [facility] ENDOSCOPY;  Service:               Gastroenterology;  Laterality: N/A; No date: KNEE SURGERY No date: thumb surgery No date: WRIST SURGERY  BMI    Body Mass Index: 23.03 kg/m      Reproductive/Obstetrics negative OB ROS                             Anesthesia Physical Anesthesia Plan  ASA: 3  Anesthesia Plan: General ETT   Post-op Pain Management:    Induction: Intravenous  PONV Risk Score and Plan: Ondansetron, Dexamethasone, Midazolam and Treatment may vary due to age or medical condition  Airway Management Planned: Oral ETT  Additional Equipment:   Intra-op Plan:   Post-operative Plan: Extubation in OR  Informed Consent: I have reviewed the patients History and Physical, chart,  labs and discussed the procedure including the risks, benefits and alternatives for the proposed anesthesia with the patient or authorized representative who has indicated his/her understanding and acceptance.     Dental Advisory Given  Plan Discussed with: Anesthesiologist, CRNA and Surgeon  Anesthesia Plan Comments: (Patient consented for risks of anesthesia including but not limited to:  - adverse reactions to medications - damage to eyes, teeth, lips or other oral mucosa - nerve damage due to positioning  - sore throat or hoarseness - Damage to heart, brain, nerves, lungs, other parts of body or loss of life  Patient voiced understanding.)        Anesthesia Quick Evaluation

## 2022-04-25 NOTE — Op Note (Signed)
[facility] Gastroenterology Patient Name: Debra Gutierrez Procedure Date: 04/25/2022 4:18 PM MRN: [id number] Account #: [id number] Date of Birth: [birth year] Admit Type: Inpatient Age: 88 Room: [facility] ENDO ROOM 4 Gender: Female Note Status: Finalized Instrument Name: EXALT Ercp scope Procedure:             ERCP Indications:           Common bile duct stone(s) Providers:             Lucilla Lame MD, MD Referring MD:          Precious Bard, MD (Referring MD) Medicines:             General Anesthesia Complications:         No immediate complications. Procedure:             Pre-Anesthesia Assessment:                        - Prior to the procedure, a History and Physical was                         performed, and patient medications and allergies were                         reviewed. The patient's tolerance of previous                         anesthesia was also reviewed. The risks and benefits                         of the procedure and the sedation options and risks                         were discussed with the patient. All questions were                         answered, and informed consent was obtained. Prior                         Anticoagulants: The patient has taken no previous                         anticoagulant or antiplatelet agents. ASA Grade                         Assessment: II - A patient with mild systemic disease.                         After reviewing the risks and benefits, the patient                         was deemed in satisfactory condition to undergo the                         procedure.                        After obtaining informed consent, the scope was passed  under direct vision. Throughout the procedure, the                         patient's blood pressure, pulse, and oxygen                         saturations were monitored continuously. The [employer] D  single use duodenoscope was                         introduced through the mouth, and used to inject                         contrast into and used to inject contrast into the                         ventral pancreatic duct. The ERCP was technically                         difficult and complex due to challenging cannulation                         because of intradiverticular papilla. The patient                         tolerated the procedure well. Findings:      The scout film was normal. The major papilla was located entirely within       a diverticulum. The ventral pancreatic duct was deeply cannulated with       the short-nosed traction sphincterotome. Contrast was injected. I       personally interpreted the pancreatic duct images. There was brisk flow       of contrast through the ducts. Image quality was excellent. Contrast       extended to the pancreatic duct. A wire was passed into the ventral       pancreatic duct. The bile duct could not be cannulated. Impression:            - The major papilla was located entirely within a                         diverticulum.                        - The pancreatogram was normal.                        - Attempts at a cholangiogram failed. Recommendation:        - Return patient to hospital ward for ongoing care.                        - NPO.                        - Proceed with lap chole with IOC Procedure Code(s):     --- Professional ---  [id number], Endoscopic retrograde cholangiopancreatography                         (ERCP); diagnostic, including collection of                         specimen(s) by brushing or washing, when performed                         (separate procedure)                        [id number], Endoscopic catheterization of the pancreatic                         ductal system, radiological supervision and                         interpretation Diagnosis Code(s):     --- Professional ---                         K80.50, Calculus of bile duct without cholangitis or                         cholecystitis without obstruction CPT copyright 2019 American Medical Association. All rights reserved. The codes documented in this report are preliminary and upon coder review may  be revised to meet current compliance requirements. Lucilla Lame MD, MD 04/25/2022 6:53:47 PM This report has been signed electronically. Number of Addenda: 0 Note Initiated On: 04/25/2022 4:18 PM Estimated Blood Loss:  Estimated blood loss: none.      [facility]

## 2022-04-25 NOTE — H&P (Signed)
History and Physical    Patient: Debra Gutierrez [id number] DOB: 02/15/1930 DOA: 04/25/2022 DOS: the patient was seen and examined on 04/25/2022 PCP: Patrice ParadiseMcLaughlin, Miriam K, MD  Patient coming from: Home  Chief Complaint:  Chief Complaint  Patient presents with   Cholelithiasis   HPI: Debra Gutierrez is a 88 y.o. female with medical history significant for hypertension, arthritis and irregular heartbeat who was referred to the ER by her primary care provider for evaluation after she had an abdominal ultrasound which came back abnormal. Patient states that she was in her usual state of health until 1 day prior to her admission when she developed severe right upper quadrant pain that she rated an 8 x 10 in intensity at its worst.  Pain was nonradiating and was associated with nausea but no emesis.  It subsided after a couple of hours.  She called her primary care provider who ordered a gallbladder ultrasound and it showed a dilated common bile duct which is concerning for Choledocholithiasis. Mild gallbladder wall thickening and sludge with negative sonographic Murphy sign, findings are equivocal for acute cholecystitis. Hepatic steatosis. She has not had any further episodes of abdominal pain and denies having any fever or chills. She denies having any cough,  no headache, no blurred vision, no dizziness, no lightheadedness, no chest pain, no shortness of breath, no leg swelling, no palpitations, no diaphoresis, no blurred vision no focal deficit.    Review of Systems: As mentioned in the history of present illness. All other systems reviewed and are negative. Past Medical History:  Diagnosis Date   Arthritis    Hypertension    Irregular heart beat    Past Surgical History:  Procedure Laterality Date   ABDOMINAL HYSTERECTOMY     BREAST LUMPECTOMY     COLONOSCOPY N/A 09/03/2021   Procedure: COLONOSCOPY;  Surgeon: Jaynie Collinsusso, Steven Michael, DO;  Location[facility] ENDOSCOPY;  Service:  Gastroenterology;  Laterality: N/A;   KNEE SURGERY     thumb surgery     WRIST SURGERY     Social History:  reports that she has never smoked. She has never used smokeless tobacco. She reports that she does not drink alcohol and does not use drugs.  Allergies  Allergen Reactions   Daypro [Oxaprozin] Tinitus   Penicillins Hives    History reviewed. No pertinent family history.  Prior to Admission medications   Medication Sig Start Date End Date Taking? Authorizing Provider  amLODipine (NORVASC) 2.5 MG tablet Take 2.5 mg by mouth daily.   Yes [provider]  Apple Cider Vinegar 300 MG TABS Take 900 mg by mouth.    [provider]  Cholecalciferol 25 MCG (1000 UT) tablet Take 1,000 Units by mouth daily.    [provider]  [facility] Oil 5000-500 UNIT/5ML OIL Take 1 Dose by mouth daily.    [provider]  Coenzyme Q10 (CO Q 10) 10 MG CAPS Take 10 mg by mouth daily.    [provider]  DOCOSAHEXAENOIC ACID PO Take 1 tablet by mouth daily.    [provider]  lisinopril (PRINIVIL,ZESTRIL) 20 MG tablet Take 20 mg by mouth daily. 04/16/17   [provider]  NON FORMULARY Take 1 Dose by mouth daily.    [provider]  Red Yeast Rice 600 MG CAPS Take 600 mg by mouth.    [provider]    Physical Exam: Vitals:   04/25/22 1415 04/25/22 1445 04/25/22 1454 04/25/22 1550  BP:   Marland Kitchen(!)  158/80 (!) 156/69  Pulse: 71 68  65  Resp: 17 17  18   Temp:    98.2 F (36.8 C)  TempSrc:    Oral  SpO2: 95% 98%  98%  Weight:      Height:       Physical Exam Vitals and nursing note reviewed.  Constitutional:      Appearance: Normal appearance.  HENT:     Head: Normocephalic and atraumatic.     Nose: Nose normal.     Mouth/Throat:     Mouth: Mucous membranes are moist.  Eyes:     Pupils: Pupils are equal, round, and reactive to light.  Cardiovascular:     Rate and Rhythm: Normal rate and regular rhythm.  Pulmonary:      Effort: Pulmonary effort is normal.     Breath sounds: Normal breath sounds.  Abdominal:     General: Abdomen is flat.     Palpations: Abdomen is soft.     Tenderness: There is abdominal tenderness.     Comments: Right upper quadrant tenderness  Musculoskeletal:        General: Normal range of motion.     Cervical back: Normal range of motion and neck supple.  Skin:    General: Skin is warm and dry.  Neurological:     General: No focal deficit present.     Mental Status: She is alert and oriented to person, place, and time.  Psychiatric:        Mood and Affect: Mood normal.        Behavior: Behavior normal.    Data Reviewed: Labs reviewed and essentially negative except for transaminitis with AST 290, ALT 307, total bilirubin 1.3, lipase 27 Gallbladder ultrasound shows dilated common bile duct which is concerning for choledocholithiasis. Recommend MRCP for further evaluation.Mild gallbladder wall thickening and sludge with negative sonographic Murphy sign, findings are equivocal for acute cholecystitis. Hepatic steatosis.  There are no new results to review at this time.  Assessment and Plan: * Cholelithiasis with choledocholithiasis Patient had a gallbladder ultrasound done for evaluation of right upper quadrant pain and noted to have choledocholithiasis. Labs show marked transaminitis and elevated total bilirubin levels GI has been consulted Patient is scheduled for ERCP today Consults surgery for possible cholecystectomy Supportive care with antiemetics, IV fluids and pain control  Hypertension Blood pressure is stable Continue amlodipine and lisinopril      Advance Care Planning:   Code Status: Full Code   Consults: Gastroenterology, Surgery  Family Communication: Greater than 50% of time was spent discussing patient's condition and plan of care with her at the bedside.  All questions and concerns have been addressed.  She verbalizes understanding and agrees to  the plan.  Severity of Illness: The appropriate patient status for this patient is INPATIENT. Inpatient status is judged to be reasonable and necessary in order to provide the required intensity of service to ensure the patient's safety. The patient's presenting symptoms, physical exam findings, and initial radiographic and laboratory data in the context of their chronic comorbidities is felt to place them at high risk for further clinical deterioration. Furthermore, it is not anticipated that the patient will be medically stable for discharge from the hospital within 2 midnights of admission.   * I certify that at the point of admission it is my clinical judgment that the patient will require inpatient hospital care spanning beyond 2 midnights from the point of admission due to high intensity of service, high  risk for further deterioration and high frequency of surveillance required.*  Author: Lucile Shutters, MD 04/25/2022 4:17 PM  For on call review [email or web address].

## 2022-04-25 NOTE — Anesthesia Postprocedure Evaluation (Signed)
Anesthesia Post Note  Patient: KRYSSA RISENHOOVER  Procedure(s) Performed: ENDOSCOPIC RETROGRADE CHOLANGIOPANCREATOGRAPHY (ERCP) WITH PROPOFOL  Patient location during evaluation: Endoscopy Anesthesia Type: General Level of consciousness: awake and alert and lethargic Pain management: pain level controlled Vital Signs Assessment: post-procedure vital signs reviewed and stable Respiratory status: spontaneous breathing, nonlabored ventilation, respiratory function stable and patient connected to nasal cannula oxygen Cardiovascular status: blood pressure returned to baseline and stable Postop Assessment: no apparent nausea or vomiting Anesthetic complications: no   No notable events documented.   Last Vitals:  Vitals:   04/25/22 2046 04/25/22 2146  BP: (!) 157/70 (!) 147/53  Pulse: 64 69  Resp: 20 20  Temp: 36.6 C 36.7 C  SpO2: 99% 95%    Last Pain:  Vitals:   04/25/22 2005  TempSrc: Oral  PainSc:                  Cleda Mccreedy Neve Branscomb

## 2022-04-25 NOTE — ED Provider Notes (Signed)
[facility] Provider Note    Event Date/Time   First MD Initiated Contact with Patient 04/25/22 1439     (approximate)   History   Cholelithiasis   HPI  Debra Gutierrez is a 88 y.o. female past medical history of hypertension  Presents after having an episode of right upper abdominal pain the last 1 or 2 hours about a day ago.  It was severe and had nausea associated.  She saw her doctor and had an ultrasound done yesterday, was called and told to come to the emergency room today   No chest pain or trouble breathing.  She reports her pain subsided yesterday was located in the right upper abdomen.  Today she is feeling improved, actually ate a full breakfast at about 9:30 AM at [employer]  No fever.  No ongoing abdominal pain      Physical Exam   Triage Vital Signs: ED Triage Vitals  Enc Vitals Group     BP 04/25/22 1309 (!) 163/89     Pulse Rate 04/25/22 1309 78     Resp 04/25/22 1309 16     Temp 04/25/22 1309 98.4 F (36.9 C)     Temp Source 04/25/22 1309 Oral     SpO2 04/25/22 1309 96 %     Weight 04/25/22 1310 130 lb (59 kg)     Height 04/25/22 1310 5\' 3"  (1.6 m)     Head Circumference --      Peak Flow --      Pain Score 04/25/22 1310 0     Pain Loc --      Pain Edu? --      Excl. in GC? --     Most recent vital signs: Vitals:   04/25/22 1445 04/25/22 1454  BP:  (!) 158/80  Pulse: 68   Resp: 17   Temp:    SpO2: 98%      General: Awake, no distress.  CV:  Good peripheral perfusion.  Normal heart tone Resp:  Normal effort.  Clear Abd:  No distention.  Reports a feeling of a pressure when evaluated for Murphy sign, but otherwise denies pain on exam.  No noted discomfort in any quadrant except she reports a feeling of a somewhat bag pressure in the right upper quadrant Other:  Well-perfused extremities.  Fully alert well oriented   ED Results / Procedures / Treatments   Labs (all labs ordered are listed, but  only abnormal results are displayed) Labs Reviewed  COMPREHENSIVE METABOLIC PANEL - Abnormal; Notable for the following components:      Result Value   Glucose, Bld 124 (*)    AST 290 (*)    ALT 307 (*)    Total Bilirubin 1.3 (*)    GFR, Estimated 54 (*)    All other components within normal limits  LIPASE, BLOOD  CBC  URINALYSIS, ROUTINE W REFLEX MICROSCOPIC     EKG     RADIOLOGY  I reviewed the results of the patient's outpatient ultrasound which includes findings concerning for common bile duct dilatation.  Additionally mild gallbladder wall thickening is noted.  Noted by radiologist as equivocal for acute cholecystitis  04/27/22 Abdomen Limited RUQ (LIVER/GB)  Result Date: 04/25/2022 CLINICAL DATA:  Abdominal pain EXAM: ULTRASOUND ABDOMEN LIMITED RIGHT UPPER QUADRANT COMPARISON:  Abdominal ultrasound dated September 24, 2019 FINDINGS: Gallbladder: No gallstones. Mild gallbladder wall thickening. Sludge is seen in the gallbladder. No sonographic Murphy sign noted by sonographer. Common  bile duct: Diameter: 10.4 Liver: Simple appearing cyst of the left lobe of the liver measuring up to 4.3 cm and 1.7 cm. Heterogeneous echotexture with increased parenchymal echogenicity. Portal vein is patent on color Doppler imaging with normal direction of blood flow towards the liver. Other: None. IMPRESSION: 1. Dilated common bile duct which is concerning for choledocholithiasis. Recommend MRCP for further evaluation. 2. Mild gallbladder wall thickening and sludge with negative sonographic Murphy sign, findings are equivocal for acute cholecystitis. 3. Hepatic steatosis. Electronically Signed   By: Allegra Lai M.D.   On: 04/25/2022 09:17        PROCEDURES:  Critical Care performed: No  Procedures   MEDICATIONS ORDERED IN ED: Medications - No data to display   IMPRESSION / MDM / ASSESSMENT AND PLAN / ED COURSE  I reviewed the triage vital signs and the nursing notes.                               Differential diagnosis includes, but is not limited to, choledocholithiasis, hepatitis, cholecystitis, acute hepatobiliary dysfunction, pancreatitis, mass or other obstructing lesion.  No associated cardiopulmonary symptoms.  No fever.  Labs with normal CBC.  Comprehensive metabolic panel shows mild transaminase elevation and minimal elevation of bilirubin  Patient's presentation is most consistent with acute presentation with potential threat to life or bodily function.  The patient is on the cardiac monitor to evaluate for evidence of arrhythmia and/or significant heart rate changes.   ----------------------------------------- 3:41 PM on 04/25/2022 ----------------------------------------- Consulted with and patient noted to Dr. Joylene Igo.  Dr. Everlene Farrier, from general surgery also consulting.  I have discussed this case with gastroenterology Dr. Allegra Lai, as well as Dr. Servando Snare --- who have advised keep n.p.o., GI to see for full consult this afternoon including possible ERCP (per Dr. Servando Snare)  Patient admitted for further care and treatment under the hospitalist service.   FINAL CLINICAL IMPRESSION(S) / ED DIAGNOSES   Final diagnoses:  Dilated bile duct  RUQ abdominal pain     Rx / DC Orders   ED Discharge Orders     None        Note:  This document was prepared using Dragon voice recognition software and may include unintentional dictation errors.   Sharyn Creamer, MD 04/25/22 [phone]

## 2022-04-25 NOTE — Assessment & Plan Note (Signed)
Blood pressure is stable Continue amlodipine and lisinopril 

## 2022-04-25 NOTE — Consult Note (Signed)
Patient ID: Debra Gutierrez, female   DOB: 1930/04/15, 88 y.o.   MRN: [id number]  HPI Debra Gutierrez is a 88 y.o. female seen in consultation at the request of Dr. Joylene Igo.  He was told by primary care provider to come to the hospital because ultrasound showed evidence of potential cholecystitis and potential choledocholithiasis.  She reports she did have an episode of right upper quadrant pain for the last couple of days that now has subsided.  She denies any fevers and chills and the pain was moderate sharp in nature without any specific alleviating or aggravating factors.  Patient denies any jaundice or biliary obstruction. Further work-up here in the ER included right upper quadrant ultrasound showing evidence of sludge and a dilated common bile duct more than 10 mm.  Please note that there was no definitive gallstones on ultrasound but she just recently had a CTA where there is clearly gallstones and this signifies that she probably passed that stone in the gallbladder into the common bile duct. Is also note that she is very functional.  She is able to walk on her own without a walker and she is able to drive herself.  She lives on her own and she is competent and her mind is pristine.  The family is at the bedside. He had a prior history of tubal ligation several years ago and abdominal hysterectomy.  He did have increase in the AST and ALT and mild elevation of the total bilirubin up to 1.3. Her CBC is completely normal. He did have a recent episode of rectal bleeding and she had a CT angio that I have personally reviewed with evidence of cholelithiasis but no other acute intra-abdominal pathology.   HPI  Past Medical History:  Diagnosis Date   Arthritis    Hypertension    Irregular heart beat     Past Surgical History:  Procedure Laterality Date   ABDOMINAL HYSTERECTOMY     BREAST LUMPECTOMY     COLONOSCOPY N/A 09/03/2021   Procedure: COLONOSCOPY;  Surgeon: Jaynie Collins, DO;  [facility] ENDOSCOPY;  Service: Gastroenterology;  Laterality: N/A;   KNEE SURGERY     thumb surgery     WRIST SURGERY      Son had a history of cholecystectomy as well Social History Social History   Tobacco Use   Smoking status: Never   Smokeless tobacco: Never  Vaping Use   Vaping Use: Never used  Substance Use Topics   Alcohol use: No   Drug use: No    Allergies  Allergen Reactions   Daypro [Oxaprozin] Tinitus   Penicillins Hives    No current facility-administered medications for this encounter.     Review of Systems Full ROS  was asked and was negative except for the information on the HPI  Physical Exam Blood pressure (!) 158/80, pulse 68, temperature 98.4 F (36.9 C), temperature source Oral, resp. rate 17, height 5\' 3"  (1.6 m), weight 59 kg, SpO2 98 %. CONSTITUTIONAL: NAD competent in good spirits. EYES: Pupils are equal, round, and reactive to light, Sclera are non-icteric. EARS, NOSE, MOUTH AND THROAT: The oropharynx is clear. The oral mucosa is pink and moist. Hearing is intact to voice. LYMPH NODES:  Lymph nodes in the neck are normal. RESPIRATORY:  Lungs are clear. There is normal respiratory effort, with equal breath sounds bilaterally, and without pathologic use of accessory muscles. CARDIOVASCULAR: Heart is regular without murmurs, gallops, or rubs. GI: The abdomen is  soft, reducible incisional hernia 3 cms , nontender, and nondistended. There are no palpable masses. There is no hepatosplenomegaly. There are normal bowel sounds in all quadrants. GU: Rectal deferred.   MUSCULOSKELETAL: Normal muscle strength and tone. No cyanosis or edema.   SKIN: Turgor is good and there are no pathologic skin lesions or ulcers. NEUROLOGIC: Motor and sensation is grossly normal. Cranial nerves are grossly intact. PSYCH:  Oriented to person, place and time. Affect is normal.  Data Reviewed  I have personally reviewed the patient's imaging,  laboratory findings and medical records.    Assessment/Plan 88 year old female with good performance status comes in with findings consistent of choledocholithiasis and some chronic cholecystitis.  Discussed with the patient in detail and the family about her disease process.  GI to see her and perform an ERCP later today.  She definitely needs medical admission.  We will let her recover from ERCP and make sure she does not develop pancreatitis before cholecystectomy.  Tentatively place her on the schedule for robotic cholecystectomy Tuesday. SHe does have also an incisional hernia approximately 3 cm that we will fix at the same time.  I discussed the procedure in detail.  The patient was given Neurosurgeon.  We discussed the risks and benefits of a laparoscopic cholecystectomy and possible cholangiogram including, but not limited to bleeding, infection, injury to surrounding structures such as the intestine or liver, bile leak, retained gallstones, need to convert to an open procedure, prolonged diarrhea, blood clots such as  DVT, common bile duct injury, anesthesia risks, and possible need for additional procedures.  The likelihood of improvement in symptoms and return to the patient's normal status is good. We discussed the typical post-operative recovery course.  Please note that I spent greater than 75 minutes's encounter including personally reviewing all medical records, imaging studies, placing orders, coordinating her care and performing appropriate documentation   Caroleen Hamman, MD FACS General Surgeon 04/25/2022, 3:50 PM

## 2022-04-26 ENCOUNTER — Encounter: Payer: Self-pay | Admitting: Gastroenterology

## 2022-04-26 DIAGNOSIS — K859 Acute pancreatitis without necrosis or infection, unspecified: Secondary | ICD-10-CM | POA: Diagnosis not present

## 2022-04-26 DIAGNOSIS — K9189 Other postprocedural complications and disorders of digestive system: Secondary | ICD-10-CM | POA: Diagnosis not present

## 2022-04-26 DIAGNOSIS — K8070 Calculus of gallbladder and bile duct without cholecystitis without obstruction: Secondary | ICD-10-CM | POA: Diagnosis not present

## 2022-04-26 DIAGNOSIS — K838 Other specified diseases of biliary tract: Secondary | ICD-10-CM | POA: Diagnosis not present

## 2022-04-26 DIAGNOSIS — K810 Acute cholecystitis: Secondary | ICD-10-CM

## 2022-04-26 LAB — COMPREHENSIVE METABOLIC PANEL
ALT: 223 U/L — ABNORMAL HIGH (ref 0–44)
AST: 144 U/L — ABNORMAL HIGH (ref 15–41)
Albumin: 3.5 g/dL (ref 3.5–5.0)
Alkaline Phosphatase: 104 U/L (ref 38–126)
Anion gap: 7 (ref 5–15)
BUN: 14 mg/dL (ref 8–23)
CO2: 24 mmol/L (ref 22–32)
Calcium: 8.4 mg/dL — ABNORMAL LOW (ref 8.9–10.3)
Chloride: 111 mmol/L (ref 98–111)
Creatinine, Ser: 0.81 mg/dL (ref 0.44–1.00)
GFR, Estimated: >60 mL/min (ref >60)
Glucose, Bld: 131 mg/dL — ABNORMAL HIGH (ref 70–99)
Potassium: 3.5 mmol/L (ref 3.5–5.1)
Sodium: 142 mmol/L (ref 135–145)
Total Bilirubin: 1 mg/dL (ref 0.3–1.2)
Total Protein: 6.1 g/dL — ABNORMAL LOW (ref 6.5–8.1)

## 2022-04-26 LAB — CBC
HCT: 38.7 % (ref 36.0–46.0)
Hemoglobin: 12.8 g/dL (ref 12.0–15.0)
MCH: 31.6 pg (ref 26.0–34.0)
MCHC: 33.1 g/dL (ref 30.0–36.0)
MCV: 95.6 fL (ref 80.0–100.0)
Platelets: 188 10*3/uL (ref 150–400)
RBC: 4.05 MIL/uL (ref 3.87–5.11)
RDW: 12.7 % (ref 11.5–15.5)
WBC: 10.1 10*3/uL (ref 4.0–10.5)
nRBC: 0 % (ref 0.0–0.2)

## 2022-04-26 LAB — HEMOGLOBIN A1C
Hgb A1c MFr Bld: 6.1 % — ABNORMAL HIGH (ref 4.8–5.6)
Mean Plasma Glucose: 128.37 mg/dL

## 2022-04-26 LAB — LIPASE, BLOOD: Lipase: 525 U/L — ABNORMAL HIGH (ref 11–51)

## 2022-04-26 MED ORDER — METOPROLOL SUCCINATE ER 25 MG PO TB24
25.0000 mg | ORAL_TABLET | Freq: Every day | ORAL | Status: DC
  Administered 2022-04-26 – 2022-05-01 (×5): 25 mg via ORAL
  Filled 2022-04-26 (×5): qty 1

## 2022-04-26 MED ORDER — POLYETHYLENE GLYCOL 3350 17 G PO PACK
17.0000 g | PACK | Freq: Every day | ORAL | Status: DC
Start: 2022-04-26 — End: 2022-04-27
  Administered 2022-04-26 – 2022-04-27 (×2): 17 g via ORAL
  Filled 2022-04-26 (×2): qty 1

## 2022-04-26 MED ORDER — ENOXAPARIN SODIUM 40 MG/0.4ML IJ SOSY
40.0000 mg | PREFILLED_SYRINGE | INTRAMUSCULAR | Status: DC
  Administered 2022-04-26 – 2022-04-27 (×2): 40 mg via SUBCUTANEOUS
  Filled 2022-04-26 (×2): qty 0.4

## 2022-04-26 MED ORDER — INDOCYANINE GREEN 25 MG IV SOLR
2.5000 mg | Freq: Once | INTRAVENOUS | Status: AC
  Administered 2022-04-30: 2.5 mg via INTRAVENOUS
  Filled 2022-04-26: qty 1

## 2022-04-26 MED ORDER — MORPHINE SULFATE (PF) 2 MG/ML IV SOLN: 1.0000 mg | INTRAVENOUS | Status: DC | PRN

## 2022-04-26 MED ORDER — LACTATED RINGERS IV SOLN: INTRAVENOUS | Status: DC

## 2022-04-26 MED ORDER — METRONIDAZOLE 500 MG/100ML IV SOLN
500.0000 mg | Freq: Two times a day (BID) | INTRAVENOUS | Status: DC
  Administered 2022-04-26 – 2022-04-30 (×9): 500 mg via INTRAVENOUS
  Filled 2022-04-26 (×11): qty 100

## 2022-04-26 NOTE — Consult Note (Addendum)
CC: Cholecystitis  Subjective: She has some epigastric pain after ERCP, ERCP was only able to cannulate pancreatic duct. MRCP pers. Reviewed showing dilated CBD but no CBD stones or masses Lipase > 500 this am   Objective: Vital signs in last 24 hours: Temp:  [97.6 F (36.4 C)-98.6 F (37 C)] 98.6 F (37 C) (05/26 0328) Pulse Rate:  [64-84] 84 (05/26 0328) Resp:  [16-20] 20 (05/26 0328) BP: (134-191)/(53-89) 134/73 (05/26 0328) SpO2:  [94 %-99 %] 94 % (05/26 0328) Weight:  [59 kg] 59 kg (05/25 1310) Last BM Date : 04/24/22  Intake/Output from previous day: 05/25 0701 - 05/26 0700 In: 392.4 [I.V.:392.4] Out: -  Intake/Output this shift: No intake/output data recorded.  Physical exam: NAD alert she has an ice pack on her abdomen Abd: soft, now tender epigastric area, no peritonitis and no Murphy Ext: well perfused and no edema   Lab Results: CBC  Recent Labs    04/25/22 1311 04/26/22 0544  WBC 4.7 10.1  HGB 13.9 12.8  HCT 42.3 38.7  PLT 223 188   BMET Recent Labs    04/25/22 1311 04/26/22 0544  NA 140 142  K 3.7 3.5  CL 105 111  CO2 28 24  GLUCOSE 124* 131*  BUN 13 14  CREATININE 1.00 0.81  CALCIUM 9.1 8.4*   PT/INR No results for input(s): LABPROT, INR in the last 72 hours. ABG No results for input(s): PHART, HCO3 in the last 72 hours.  Invalid input(s): PCO2, PO2  Studies/Results: MR 3D Recon At Scanner  Result Date: 04/25/2022 CLINICAL DATA:  Right upper quadrant abdominal pain. Possible acute cholecystitis on ultrasound. Dilated common duct, raising the possibility of choledocholithiasis. EXAM: MRI ABDOMEN WITHOUT AND WITH CONTRAST (INCLUDING MRCP) TECHNIQUE: Multiplanar multisequence MR imaging of the abdomen was performed both before and after the administration of intravenous contrast. Heavily T2-weighted images of the biliary and pancreatic ducts were obtained, and three-dimensional MRCP images were rendered by post processing. CONTRAST:  21mL  GADAVIST GADOBUTROL 1 MMOL/ML IV SOLN COMPARISON:  Right upper quadrant ultrasound dated 04/25/2022. CTA abdomen/pelvis dated 03/16/2022. FINDINGS: Motion degraded images. Lower chest: Lung bases are clear. Hepatobiliary: Scattered hepatic cysts, measuring up to 3.0 cm in segment 3 (series 21/image 45). No suspicious/enhancing hepatic lesions. Layering tiny gallstones (series 4/image 21), with mild gallbladder wall thickening (series 4/image 26), but no convincing findings of acute cholecystitis. Very mild central intrahepatic ductal dilatation, similar to the prior. Dilated common duct, measuring 12 mm (series 3/image 22), previously 10 mm on CT. No choledocholithiasis is seen. Pancreas:  Within normal limits. Spleen:  Within normal limits. Adrenals/Urinary Tract:  Adrenal glands are within normal limits. Kidneys are within normal limits.  No hydronephrosis. Stomach/Bowel: Stomach and visualized bowel are grossly unremarkable. Vascular/Lymphatic:  No evidence of abdominal aortic aneurysm. No suspicious abdominal lymphadenopathy. Other:  No abdominal ascites. Musculoskeletal: No focal osseous lesions. IMPRESSION: Motion degraded images. Cholelithiasis with mild gallbladder wall thickening, but without convincing findings of acute cholecystitis. Dilated common duct, measuring 12 mm, minimally progressive from prior CT. No choledocholithiasis is seen. Electronically Signed   By: Charline Bills M.D.   On: 04/25/2022 23:16   DG C-Arm 1-60 Min-No Report  Result Date: 04/25/2022 Fluoroscopy was utilized by the requesting physician.  No radiographic interpretation.   MR ABDOMEN MRCP W WO CONTAST  Result Date: 04/25/2022 CLINICAL DATA:  Right upper quadrant abdominal pain. Possible acute cholecystitis on ultrasound. Dilated common duct, raising the possibility of choledocholithiasis. EXAM: MRI ABDOMEN  WITHOUT AND WITH CONTRAST (INCLUDING MRCP) TECHNIQUE: Multiplanar multisequence MR imaging of the abdomen was  performed both before and after the administration of intravenous contrast. Heavily T2-weighted images of the biliary and pancreatic ducts were obtained, and three-dimensional MRCP images were rendered by post processing. CONTRAST:  65mL GADAVIST GADOBUTROL 1 MMOL/ML IV SOLN COMPARISON:  Right upper quadrant ultrasound dated 04/25/2022. CTA abdomen/pelvis dated 03/16/2022. FINDINGS: Motion degraded images. Lower chest: Lung bases are clear. Hepatobiliary: Scattered hepatic cysts, measuring up to 3.0 cm in segment 3 (series 21/image 45). No suspicious/enhancing hepatic lesions. Layering tiny gallstones (series 4/image 21), with mild gallbladder wall thickening (series 4/image 26), but no convincing findings of acute cholecystitis. Very mild central intrahepatic ductal dilatation, similar to the prior. Dilated common duct, measuring 12 mm (series 3/image 22), previously 10 mm on CT. No choledocholithiasis is seen. Pancreas:  Within normal limits. Spleen:  Within normal limits. Adrenals/Urinary Tract:  Adrenal glands are within normal limits. Kidneys are within normal limits.  No hydronephrosis. Stomach/Bowel: Stomach and visualized bowel are grossly unremarkable. Vascular/Lymphatic:  No evidence of abdominal aortic aneurysm. No suspicious abdominal lymphadenopathy. Other:  No abdominal ascites. Musculoskeletal: No focal osseous lesions. IMPRESSION: Motion degraded images. Cholelithiasis with mild gallbladder wall thickening, but without convincing findings of acute cholecystitis. Dilated common duct, measuring 12 mm, minimally progressive from prior CT. No choledocholithiasis is seen. Electronically Signed   By: Charline Bills M.D.   On: 04/25/2022 23:16   US Abdomen Limited RUQ (LIVER/GB)  Result Date: 04/25/2022 CLINICAL DATA:  Abdominal pain EXAM: ULTRASOUND ABDOMEN LIMITED RIGHT UPPER QUADRANT COMPARISON:  Abdominal ultrasound dated September 24, 2019 FINDINGS: Gallbladder: No gallstones. Mild gallbladder wall  thickening. Sludge is seen in the gallbladder. No sonographic Murphy sign noted by sonographer. Common bile duct: Diameter: 10.4 Liver: Simple appearing cyst of the left lobe of the liver measuring up to 4.3 cm and 1.7 cm. Heterogeneous echotexture with increased parenchymal echogenicity. Portal vein is patent on color Doppler imaging with normal direction of blood flow towards the liver. Other: None. IMPRESSION: 1. Dilated common bile duct which is concerning for choledocholithiasis. Recommend MRCP for further evaluation. 2. Mild gallbladder wall thickening and sludge with negative sonographic Murphy sign, findings are equivocal for acute cholecystitis. 3. Hepatic steatosis. Electronically Signed   By: Allegra Lai M.D.   On: 04/25/2022 09:17    Anti-infectives: Anti-infectives (From admission, onward)    Start     Dose/Rate Route Frequency Ordered Stop   04/25/22 1700  cefTRIAXone (ROCEPHIN) 2 g in sodium chloride 0.9 % 100 mL IVPB        2 g 200 mL/hr over 30 Minutes Intravenous Every 24 hours 04/25/22 1559         Assessment/Plan: ACute cholecystitis, patient likely passed a stone.  No evidence of choledocholithiasis at this time.  She does have mild ovation of very lipase from manipulation of the pancreatic duct.  I will like to make sure that the lipase goes in the right direction before performing cholecystectomy.  We will tentatively get her on the schedule for Tuesday.  Procedure discussed with her in detail.  Risks, benefits and possible complications.  Also procedures and situation discussed with daughter-in-law Collier Flowers and Monte Fantasia. I have also had a discussion with Dr. Ashok Pall. Please note that I spent 50 minutes in this encounter including personally reviewing imaging studies, coordinating her care, counseling both family members and the patient, placing orders and performing appropriate documentation   Sterling Big, MD, FACS  04/26/2022      

## 2022-04-26 NOTE — Evaluation (Signed)
Physical Therapy Evaluation Patient Details Name: Debra Gutierrez MRN: [id number] DOB: [birth year] Today's Date: 04/26/2022  History of Present Illness  Pt is a 88 y/o F admitted on 04/25/22 after being referred by her PCP to the ED for evaluation after having an abnormal abdominal US. Pt is being treated for choledocholithiasis. Pt underwent ERCP on 04/25/22. PMH: HTN, arthritis, irregular heartbeat  Clinical Impression  Pt seen for PT evaluation with pt reporting independence prior to admission. Pt initially reports 1 fall ~6 weeks ago & also states "I fall quite often" but then states she's only fallen 2 times total since July of 2 years ago. On this date, pt is mod I with bed mobility & STS & ambulates around nurses station without AD & supervision with slight lateral sway. Will continue to follow pt acutely to address high level balance & to perform Berg Balance Test during next session.     Recommendations for follow up therapy are one component of a multi-disciplinary discharge planning process, led by the attending physician.  Recommendations may be updated based on patient status, additional functional criteria and insurance authorization.  Follow Up Recommendations Outpatient PT    Assistance Recommended at Discharge PRN  Patient can return home with the following  Assistance with cooking/housework    Equipment Recommendations None recommended by PT  Recommendations for Other Services       Functional Status Assessment Patient has had a recent decline in their functional status and demonstrates the ability to make significant improvements in function in a reasonable and predictable amount of time.     Precautions / Restrictions Precautions Precautions: None Restrictions Weight Bearing Restrictions: No      Mobility  Bed Mobility Overal bed mobility: Modified Independent             General bed mobility comments: supine<>sit with HOB slightly elevated     Transfers Overall transfer level: Modified independent Equipment used: None                    Ambulation/Gait Ambulation/Gait assistance: Supervision Gait Distance (Feet): 300 Feet Assistive device: None         General Gait Details: slight lateral sway R  Stairs            Wheelchair Mobility    Modified Rankin (Stroke Patients Only)       Balance Overall balance assessment: Needs assistance Sitting-balance support: Feet supported Sitting balance-Leahy Scale: Normal     Standing balance support: No upper extremity supported, During functional activity Standing balance-Leahy Scale: Good                               Pertinent Vitals/Pain Pain Assessment Pain Assessment: No/denies pain    Home Living Family/patient expects to be discharged to:: Private residence Living Arrangements: Alone Available Help at Discharge: Family;Available PRN/intermittently Type of Home: House Home Access: Stairs to enter   Entrance Stairs-Number of Steps: 1   Home Layout: One level Home Equipment: Agricultural consultant (2 wheels);Cane - single point      Prior Function Prior Level of Function : Independent/Modified Independent;Driving             Mobility Comments: Pt endorses 2 falls in past 2 years, reports most falls are outside. ADLs Comments: Cooking, cleaning, bathing, dressing without assistance.     Hand Dominance        Extremity/Trunk Assessment   Upper Extremity Assessment  Upper Extremity Assessment: Overall WFL for tasks assessed    Lower Extremity Assessment Lower Extremity Assessment: Overall WFL for tasks assessed       Communication   Communication: HOH  Cognition Arousal/Alertness: Awake/alert Behavior During Therapy: WFL for tasks assessed/performed Overall Cognitive Status: Within Functional Limits for tasks assessed                                          General Comments General comments  (skin integrity, edema, etc.): Pt toilets independently.    Exercises     Assessment/Plan    PT Assessment Patient needs continued PT services  PT Problem List Decreased balance       PT Treatment Interventions DME instruction;Therapeutic exercise;Gait training;Balance training;Stair training;Functional mobility training;Therapeutic activities;Patient/family education    PT Goals (Current goals can be found in the Care Plan section)  Acute Rehab PT Goals Patient Stated Goal: go home PT Goal Formulation: With patient Time For Goal Achievement: 05/10/22 Potential to Achieve Goals: Good    Frequency Min 2X/week     Co-evaluation               AM-PAC PT "6 Clicks" Mobility  Outcome Measure Help needed turning from your back to your side while in a flat bed without using bedrails?: None Help needed moving from lying on your back to sitting on the side of a flat bed without using bedrails?: None Help needed moving to and from a bed to a chair (including a wheelchair)?: None Help needed standing up from a chair using your arms (e.g., wheelchair or bedside chair)?: None Help needed to walk in hospital room?: A Little Help needed climbing 3-5 steps with a railing? : A Little 6 Click Score: 22    End of Session   Activity Tolerance: Patient tolerated treatment well Patient left: in chair;with chair alarm set;with call bell/phone within reach;with family/visitor present Nurse Communication: Mobility status PT Visit Diagnosis: Unsteadiness on feet (R26.81)    Time: [id number] PT Time Calculation (min) (ACUTE ONLY): 22 min   Charges:   PT Evaluation $PT Eval Low Complexity: 1 Low          Lavone Nian, PT, DPT 04/26/22, 3:05 PM   Waunita Schooner 04/26/2022, 3:02 PM

## 2022-04-26 NOTE — TOC Initial Note (Signed)
Transition of Care [facility]) - Initial/Assessment Note    Patient Details  Name: Debra Gutierrez MRN: [id number] Date of Birth: [birth year]  Transition of Care [facility]) CM/SW Contact:    Beverly Sessions, RN Phone Number: 04/26/2022, 3:49 PM  Clinical Narrative:                  Plan for lap chole Tuesday PT pending   Transition of Care [facility]) Screening Note   Patient Details  Name: Debra Gutierrez Date of Birth: 12-19-1929   Transition of Care [facility]) CM/SW Contact:    Beverly Sessions, RN Phone Number: 04/26/2022, 3:49 PM    Transition of Care Department [facility]) has reviewed patient and no TOC needs have been identified at this time. We will continue to monitor patient advancement through interdisciplinary progression rounds. If new patient transition needs arise, please place a TOC consult.          Patient Goals and CMS Choice        Expected Discharge Plan and Services                                                Prior Living Arrangements/Services                       Activities of Daily Living Home Assistive Devices/Equipment: Eyeglasses ADL Screening (condition at time of admission) Patient's cognitive ability adequate to safely complete daily activities?: Yes Is the patient deaf or have difficulty hearing?: No Does the patient have difficulty seeing, even when wearing glasses/contacts?: No Does the patient have difficulty concentrating, remembering, or making decisions?: No Patient able to express need for assistance with ADLs?: Yes Does the patient have difficulty dressing or bathing?: No Independently performs ADLs?: Yes (appropriate for developmental age) Does the patient have difficulty walking or climbing stairs?: No Weakness of Legs: None Weakness of Arms/Hands: None  Permission Sought/Granted                  Emotional Assessment              Admission diagnosis:  Cholelithiasis with choledocholithiasis  [K80.70] Patient Active Problem List   Diagnosis Date Noted   Acute cholecystitis 04/26/2022   Post-ERCP acute pancreatitis 04/26/2022   Cholelithiasis with choledocholithiasis 04/25/2022   Hypertension    Premature atrial complex 10/10/2021   Osteoporosis 03/08/2015   History of total knee arthroplasty, left 05/19/2014   Trigeminal neuralgia of left side of face 09/08/2013   PCP:  Marinda Elk, MD Pharmacy:   [place], [place] - [place] [address] [place] [address] Phone: [phone] Fax: [phone]     Social Determinants of Health (SDOH) Interventions    Readmission Risk Interventions     View : No data to display.

## 2022-04-26 NOTE — Progress Notes (Signed)
Nutrition Brief Note  Patient identified on the Malnutrition Screening Tool (MST) Report  Wt Readings from Last 15 Encounters:  04/25/22 59 kg  04/20/22 63 kg  09/03/21 63 kg  07/05/17 61.7 kg   Pt with medical history significant for hypertension, arthritis and irregular heartbeat who was referred for evaluation after she had an abdominal ultrasound which came back abnormal.  Pt admitted with cholelithiasis and choledocholithiasis.   5/25- s/p ERCP- revealed major papilla located entirely within a diverticulum, failed cholangiogram  Reviewed I/O's: +392 ml x 24 hours  Per MD notes, pt awaiting surgery consult.   Case discussed with RN, pt just advanced to clear liquid diet.   Spoke with pt at bedside, who reports feeling better today. Son at bedside also assists with history. Pt reports good appetite and is looking forward to eating, joking with this RD that she is ready to eat an ice cream sandwich. Pt reports she usually consumes 2-3 meals per day (Breakfast: eggs, toast, and coffee; Lunch: sandwich; Dinner: meat, starch and vegetable). Pt also admits to snacking throughout the day on ice cream sandwiches.  Pt unsure of UBW, but does not think that she has lost weight ("I don't feel any different"). Reviewed wt hx; pt has experienced a 6.3% wt loss over the past 7 months.   Nutrition-Focused physical exam completed. Findings are no fat depletion, no muscle depletion, and mild edema.    Medications reviewed and include vitamin D3.   Labs reviewed.   Current diet order is clear liquid, patient is consuming approximately n/a% of meals at this time. Labs and medications reviewed.   No nutrition interventions warranted at this time. If nutrition issues arise, please consult RD.   Loistine Chance, RD, LDN, [place] Registered Dietitian II Certified Diabetes Care and Education Specialist Please refer to [facility] for RD and/or RD on-call/weekend/after hours pager

## 2022-04-26 NOTE — Progress Notes (Signed)
PROGRESS NOTE    Debra Gutierrez  HCW:[id number] DOB: 05-11-1930 DOA: 04/25/2022 PCP: Patrice Paradise, MD  Outpatient Specialists: neurology    Brief Narrative:  From admission h and p   Assessment & Plan:   Principal Problem:   Cholelithiasis with choledocholithiasis Active Problems:   Hypertension  # Cholelithiasis with possible choledocholithiasis # Cholecystitis # Post-ERCP pancreatitis Two recent bouts of ruq abdominal pain. Exam and u/s show possible cholecystitis. CBD dilated on u/s. ERCP unsuccessful. MRCP with dilated cbd, no stone seen. Suspect passed gallbladder stone. GI and gen surg following. Today mild ruq pain, hemodynamically stable. Lipase 500s. LFTs mildly elevated - IVF - clear liquid diet, advance as tolerated - ceftriaxone/metronidazole - plan right now is cholecystectomy on Tuesday 5/30  # HTN Bp wnl - cont home metop, amlod, lisinopril  # Elevated glucose - f/u a1c    DVT prophylaxis: lovenox Code Status: full Family Communication: son updated @ bedside  Level of care: Med-Surg Status is: Inpatient Remains inpatient appropriate because: need for IV abx    Consultants:  GI, gen surg  Procedures: ERCP 5/25  Antimicrobials:  Ceftriaxone/metronidazole 5/26>    Subjective: Mild ruq pain. Has appetite. No n/v. No fever.  Objective: Vitals:   04/25/22 2005 04/25/22 2046 04/25/22 2146 04/26/22 0328  BP: (!) 163/75 (!) 157/70 (!) 147/53 134/73  Pulse: 64 64 69 84  Resp: 16 20 20 20   Temp: 97.7 F (36.5 C) 97.9 F (36.6 C) 98.1 F (36.7 C) 98.6 F (37 C)  TempSrc: Oral     SpO2: 99% 99% 95% 94%  Weight:      Height:        Intake/Output Summary (Last 24 hours) at 04/26/2022 0918 Last data filed at 04/26/2022 0325 Gross per 24 hour  Intake 392.42 ml  Output --  Net 392.42 ml   Filed Weights   04/25/22 1310  Weight: 59 kg    Examination:  General exam: Appears calm and comfortable  Respiratory system:  Clear to auscultation. Respiratory effort normal. Cardiovascular system: S1 & S2 heard, RRR. No JVD, murmurs, rubs, gallops or clicks. No pedal edema. Gastrointestinal system: Abdomen is nondistended, mild ruq tenderness. No organomegaly or masses felt. Normal bowel sounds heard. Central nervous system: Alert and oriented. No focal neurological deficits. Extremities: Symmetric 5 x 5 power. Skin: No rashes, lesions or ulcers Psychiatry: Judgement and insight appear normal. Mood & affect appropriate.     Data Reviewed: I have personally reviewed following labs and imaging studies  CBC: Recent Labs  Lab 04/20/22 1120 04/25/22 1311 04/26/22 0544  WBC 4.2 4.7 10.1  HGB 14.4 13.9 12.8  HCT 44.0 42.3 38.7  MCV 97.8 97.5 95.6  PLT 222 223 188   Basic Metabolic Panel: Recent Labs  Lab 04/20/22 1120 04/25/22 1311 04/26/22 0544  NA 138 140 142  K 4.3 3.7 3.5  CL 105 105 111  CO2 27 28 24   GLUCOSE 118* 124* 131*  BUN 17 13 14   CREATININE 0.79 1.00 0.81  CALCIUM 9.4 9.1 8.4*   GFR: Estimated Creatinine Clearance: 38.2 mL/min (by C-G formula based on SCr of 0.81 mg/dL). Liver Function Tests: Recent Labs  Lab 04/20/22 1120 04/25/22 1311 04/26/22 0544  AST 19 290* 144*  ALT 18 307* 223*  ALKPHOS 75 124 104  BILITOT 0.8 1.3* 1.0  PROT 7.5 7.5 6.1*  ALBUMIN 4.4 4.4 3.5   Recent Labs  Lab 04/25/22 1311 04/26/22 0544  LIPASE 27 525*   No  results for input(s): AMMONIA in the last 168 hours. Coagulation Profile: No results for input(s): INR, PROTIME in the last 168 hours. Cardiac Enzymes: No results for input(s): CKTOTAL, CKMB, CKMBINDEX, TROPONINI in the last 168 hours. BNP (last 3 results) No results for input(s): PROBNP in the last 8760 hours. HbA1C: No results for input(s): HGBA1C in the last 72 hours. CBG: No results for input(s): GLUCAP in the last 168 hours. Lipid Profile: No results for input(s): CHOL, HDL, LDLCALC, TRIG, CHOLHDL, LDLDIRECT in the last 72  hours. Thyroid Function Tests: No results for input(s): TSH, T4TOTAL, FREET4, T3FREE, THYROIDAB in the last 72 hours. Anemia Panel: No results for input(s): VITAMINB12, FOLATE, FERRITIN, TIBC, IRON, RETICCTPCT in the last 72 hours. Urine analysis:    Component Value Date/Time   COLORURINE YELLOW (A) 04/25/2022 1535   APPEARANCEUR CLEAR (A) 04/25/2022 1535   LABSPEC 1.009 04/25/2022 1535   PHURINE 7.0 04/25/2022 1535   GLUCOSEU NEGATIVE 04/25/2022 1535   HGBUR NEGATIVE 04/25/2022 1535   BILIRUBINUR NEGATIVE 04/25/2022 1535   KETONESUR NEGATIVE 04/25/2022 1535   PROTEINUR NEGATIVE 04/25/2022 1535   NITRITE NEGATIVE 04/25/2022 1535   LEUKOCYTESUR NEGATIVE 04/25/2022 1535   Sepsis Labs: @LABRCNTIP (procalcitonin:4,lacticidven:4)  )No results found for this or any previous visit (from the past 240 hour(s)).       Radiology Studies: MR 3D Recon At Scanner  Result Date: 04/25/2022 CLINICAL DATA:  Right upper quadrant abdominal pain. Possible acute cholecystitis on ultrasound. Dilated common duct, raising the possibility of choledocholithiasis. EXAM: MRI ABDOMEN WITHOUT AND WITH CONTRAST (INCLUDING MRCP) TECHNIQUE: Multiplanar multisequence MR imaging of the abdomen was performed both before and after the administration of intravenous contrast. Heavily T2-weighted images of the biliary and pancreatic ducts were obtained, and three-dimensional MRCP images were rendered by post processing. CONTRAST:  96mL GADAVIST GADOBUTROL 1 MMOL/ML IV SOLN COMPARISON:  Right upper quadrant ultrasound dated 04/25/2022. CTA abdomen/pelvis dated 03/16/2022. FINDINGS: Motion degraded images. Lower chest: Lung bases are clear. Hepatobiliary: Scattered hepatic cysts, measuring up to 3.0 cm in segment 3 (series 21/image 45). No suspicious/enhancing hepatic lesions. Layering tiny gallstones (series 4/image 21), with mild gallbladder wall thickening (series 4/image 26), but no convincing findings of acute  cholecystitis. Very mild central intrahepatic ductal dilatation, similar to the prior. Dilated common duct, measuring 12 mm (series 3/image 22), previously 10 mm on CT. No choledocholithiasis is seen. Pancreas:  Within normal limits. Spleen:  Within normal limits. Adrenals/Urinary Tract:  Adrenal glands are within normal limits. Kidneys are within normal limits.  No hydronephrosis. Stomach/Bowel: Stomach and visualized bowel are grossly unremarkable. Vascular/Lymphatic:  No evidence of abdominal aortic aneurysm. No suspicious abdominal lymphadenopathy. Other:  No abdominal ascites. Musculoskeletal: No focal osseous lesions. IMPRESSION: Motion degraded images. Cholelithiasis with mild gallbladder wall thickening, but without convincing findings of acute cholecystitis. Dilated common duct, measuring 12 mm, minimally progressive from prior CT. No choledocholithiasis is seen. Electronically Signed   By: 03/18/2022 M.D.   On: 04/25/2022 23:16   DG C-Arm 1-60 Min-No Report  Result Date: 04/25/2022 Fluoroscopy was utilized by the requesting physician.  No radiographic interpretation.   MR ABDOMEN MRCP W WO CONTAST  Result Date: 04/25/2022 CLINICAL DATA:  Right upper quadrant abdominal pain. Possible acute cholecystitis on ultrasound. Dilated common duct, raising the possibility of choledocholithiasis. EXAM: MRI ABDOMEN WITHOUT AND WITH CONTRAST (INCLUDING MRCP) TECHNIQUE: Multiplanar multisequence MR imaging of the abdomen was performed both before and after the administration of intravenous contrast. Heavily T2-weighted images of the biliary and  pancreatic ducts were obtained, and three-dimensional MRCP images were rendered by post processing. CONTRAST:  5mL GADAVIST GADOBUTROL 1 MMOL/ML IV SOLN COMPARISON:  Right upper quadrant ultrasound dated 04/25/2022. CTA abdomen/pelvis dated 03/16/2022. FINDINGS: Motion degraded images. Lower chest: Lung bases are clear. Hepatobiliary: Scattered hepatic cysts,  measuring up to 3.0 cm in segment 3 (series 21/image 45). No suspicious/enhancing hepatic lesions. Layering tiny gallstones (series 4/image 21), with mild gallbladder wall thickening (series 4/image 26), but no convincing findings of acute cholecystitis. Very mild central intrahepatic ductal dilatation, similar to the prior. Dilated common duct, measuring 12 mm (series 3/image 22), previously 10 mm on CT. No choledocholithiasis is seen. Pancreas:  Within normal limits. Spleen:  Within normal limits. Adrenals/Urinary Tract:  Adrenal glands are within normal limits. Kidneys are within normal limits.  No hydronephrosis. Stomach/Bowel: Stomach and visualized bowel are grossly unremarkable. Vascular/Lymphatic:  No evidence of abdominal aortic aneurysm. No suspicious abdominal lymphadenopathy. Other:  No abdominal ascites. Musculoskeletal: No focal osseous lesions. IMPRESSION: Motion degraded images. Cholelithiasis with mild gallbladder wall thickening, but without convincing findings of acute cholecystitis. Dilated common duct, measuring 12 mm, minimally progressive from prior CT. No choledocholithiasis is seen. Electronically Signed   By: Charline BillsSriyesh  Krishnan M.D.   On: 04/25/2022 23:16   US Abdomen Limited RUQ (LIVER/GB)  Result Date: 04/25/2022 CLINICAL DATA:  Abdominal pain EXAM: ULTRASOUND ABDOMEN LIMITED RIGHT UPPER QUADRANT COMPARISON:  Abdominal ultrasound dated September 24, 2019 FINDINGS: Gallbladder: No gallstones. Mild gallbladder wall thickening. Sludge is seen in the gallbladder. No sonographic Murphy sign noted by sonographer. Common bile duct: Diameter: 10.4 Liver: Simple appearing cyst of the left lobe of the liver measuring up to 4.3 cm and 1.7 cm. Heterogeneous echotexture with increased parenchymal echogenicity. Portal vein is patent on color Doppler imaging with normal direction of blood flow towards the liver. Other: None. IMPRESSION: 1. Dilated common bile duct which is concerning for  choledocholithiasis. Recommend MRCP for further evaluation. 2. Mild gallbladder wall thickening and sludge with negative sonographic Murphy sign, findings are equivocal for acute cholecystitis. 3. Hepatic steatosis. Electronically Signed   By: Allegra LaiLeah  Strickland M.D.   On: 04/25/2022 09:17        Scheduled Meds:  amLODipine  2.5 mg Oral Daily   cholecalciferol  1,000 Units Oral Daily   [START ON 04/30/2022] indocyanine green  2.5 mg Intravenous Once   lisinopril  20 mg Oral Daily   Continuous Infusions:  cefTRIAXone (ROCEPHIN)  IV     lactated ringers       LOS: 1 day     Silvano BilisNoah B Klyn Kroening, MD [facility]   If 7PM-7AM, please contact night-coverage [email or web address] Password [email or web address] 04/26/2022, 9:18 AM

## 2022-04-26 NOTE — Progress Notes (Signed)
Debra Repress, MD [address]  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  Pancreatic duct was cannulated.  Patient did note mild epigastric discomfort after ERCP and had elevated lipase.  This morning when I saw her, she reports feeling significant CKD only.  She denies any nausea or vomiting and she has been tolerating clears well.  She only reports mild discomfort when someone mashes on her belly.  Patient underwent MRCP, did not reveal choledocholithiasis   Objective: Vital signs in last 24 hours: Vitals:   04/25/22 2046 04/25/22 2146 04/26/22 0328 04/26/22 0925  BP: (!) 157/70 (!) 147/53 134/73 (!) 143/68  Pulse: 64 69 84 73  Resp: 20 20 20 18   Temp: 97.9 F (36.6 C) 98.1 F (36.7 C) 98.6 F (37 C) 98.2 F (36.8 C)  TempSrc:    Oral  SpO2: 99% 95% 94% 93%  Weight:      Height:       Weight change:   Intake/Output Summary (Last 24 hours) at 04/26/2022 1519 Last data filed at 04/26/2022 0325 Gross per 24 hour  Intake 392.42 ml  Output --  Net 392.42 ml     Exam: Heart:: Regular rate and rhythm or S1S2 present Lungs: normal and clear to auscultation Abdomen: Very mild tenderness on deep palpation in epigastric area, no abdominal guarding or rigidity   Lab Results:    Latest Ref Rng & Units 04/26/2022    5:44 AM 04/25/2022    1:11 PM 04/20/2022   11:20 AM  CBC  WBC 4.0 - 10.5 K/uL 10.1   4.7   4.2    Hemoglobin 12.0 - 15.0 g/dL 04/22/2022   32.4   40.1    Hematocrit 36.0 - 46.0 % 38.7   42.3   44.0    Platelets 150 - 400 K/uL 188   223   222        Latest Ref Rng & Units 04/26/2022    5:44 AM 04/25/2022    1:11 PM 04/20/2022   11:20 AM  CMP  Glucose 70 - 99 mg/dL 04/22/2022   253   664    BUN 8 - 23 mg/dL 14   13   17     Creatinine 0.44 - 1.00 mg/dL 403     4.74    Sodium 135 - 145 mmol/L  142   140   138    Potassium 3.5 - 5.1 mmol/L 3.5   3.7   4.3    Chloride 98 - 111 mmol/L 111   105   105    CO2 22 - 32 mmol/L 24   28   27     Calcium 8.9 - 10.3 mg/dL 8.4   9.1   9.4    Total Protein 6.5 - 8.1 g/dL 6.1   7.5   7.5    Total Bilirubin 0.3 - 1.2 mg/dL 1.0   1.3   0.8    Alkaline Phos 38 - 126 U/L 104   124   75    AST 15 - 41 U/L 144   290   19    ALT 0 - 44 U/L 223   307   18      Micro Results: No results found for this or any previous visit (from the past 240 hour(s)). Studies/Results: MR 3D Recon  At Scanner  Result Date: 04/25/2022 CLINICAL DATA:  Right upper quadrant abdominal pain. Possible acute cholecystitis on ultrasound. Dilated common duct, raising the possibility of choledocholithiasis. EXAM: MRI ABDOMEN WITHOUT AND WITH CONTRAST (INCLUDING MRCP) TECHNIQUE: Multiplanar multisequence MR imaging of the abdomen was performed both before and after the administration of intravenous contrast. Heavily T2-weighted images of the biliary and pancreatic ducts were obtained, and three-dimensional MRCP images were rendered by post processing. CONTRAST:  81mL GADAVIST GADOBUTROL 1 MMOL/ML IV SOLN COMPARISON:  Right upper quadrant ultrasound dated 04/25/2022. CTA abdomen/pelvis dated 03/16/2022. FINDINGS: Motion degraded images. Lower chest: Lung bases are clear. Hepatobiliary: Scattered hepatic cysts, measuring up to 3.0 cm in segment 3 (series 21/image 45). No suspicious/enhancing hepatic lesions. Layering tiny gallstones (series 4/image 21), with mild gallbladder wall thickening (series 4/image 26), but no convincing findings of acute cholecystitis. Very mild central intrahepatic ductal dilatation, similar to the prior. Dilated common duct, measuring 12 mm (series 3/image 22), previously 10 mm on CT. No choledocholithiasis is seen. Pancreas:  Within normal limits. Spleen:  Within normal limits. Adrenals/Urinary Tract:  Adrenal glands are within normal limits. Kidneys are within normal  limits.  No hydronephrosis. Stomach/Bowel: Stomach and visualized bowel are grossly unremarkable. Vascular/Lymphatic:  No evidence of abdominal aortic aneurysm. No suspicious abdominal lymphadenopathy. Other:  No abdominal ascites. Musculoskeletal: No focal osseous lesions. IMPRESSION: Motion degraded images. Cholelithiasis with mild gallbladder wall thickening, but without convincing findings of acute cholecystitis. Dilated common duct, measuring 12 mm, minimally progressive from prior CT. No choledocholithiasis is seen. Electronically Signed   By: Charline Bills M.D.   On: 04/25/2022 23:16   DG C-Arm 1-60 Min-No Report  Result Date: 04/25/2022 Fluoroscopy was utilized by the requesting physician.  No radiographic interpretation.   MR ABDOMEN MRCP W WO CONTAST  Result Date: 04/25/2022 CLINICAL DATA:  Right upper quadrant abdominal pain. Possible acute cholecystitis on ultrasound. Dilated common duct, raising the possibility of choledocholithiasis. EXAM: MRI ABDOMEN WITHOUT AND WITH CONTRAST (INCLUDING MRCP) TECHNIQUE: Multiplanar multisequence MR imaging of the abdomen was performed both before and after the administration of intravenous contrast. Heavily T2-weighted images of the biliary and pancreatic ducts were obtained, and three-dimensional MRCP images were rendered by post processing. CONTRAST:  27mL GADAVIST GADOBUTROL 1 MMOL/ML IV SOLN COMPARISON:  Right upper quadrant ultrasound dated 04/25/2022. CTA abdomen/pelvis dated 03/16/2022. FINDINGS: Motion degraded images. Lower chest: Lung bases are clear. Hepatobiliary: Scattered hepatic cysts, measuring up to 3.0 cm in segment 3 (series 21/image 45). No suspicious/enhancing hepatic lesions. Layering tiny gallstones (series 4/image 21), with mild gallbladder wall thickening (series 4/image 26), but no convincing findings of acute cholecystitis. Very mild central intrahepatic ductal dilatation, similar to the prior. Dilated common duct, measuring 12 mm  (series 3/image 22), previously 10 mm on CT. No choledocholithiasis is seen. Pancreas:  Within normal limits. Spleen:  Within normal limits. Adrenals/Urinary Tract:  Adrenal glands are within normal limits. Kidneys are within normal limits.  No hydronephrosis. Stomach/Bowel: Stomach and visualized bowel are grossly unremarkable. Vascular/Lymphatic:  No evidence of abdominal aortic aneurysm. No suspicious abdominal lymphadenopathy. Other:  No abdominal ascites. Musculoskeletal: No focal osseous lesions. IMPRESSION: Motion degraded images. Cholelithiasis with mild gallbladder wall thickening, but without convincing findings of acute cholecystitis. Dilated common duct, measuring 12 mm, minimally progressive from prior CT. No choledocholithiasis is seen. Electronically Signed   By: Charline Bills M.D.   On: 04/25/2022 23:16   US Abdomen Limited RUQ (LIVER/GB)  Result Date: 04/25/2022 CLINICAL DATA:  Abdominal pain  EXAM: ULTRASOUND ABDOMEN LIMITED RIGHT UPPER QUADRANT COMPARISON:  Abdominal ultrasound dated September 24, 2019 FINDINGS: Gallbladder: No gallstones. Mild gallbladder wall thickening. Sludge is seen in the gallbladder. No sonographic Murphy sign noted by sonographer. Common bile duct: Diameter: 10.4 Liver: Simple appearing cyst of the left lobe of the liver measuring up to 4.3 cm and 1.7 cm. Heterogeneous echotexture with increased parenchymal echogenicity. Portal vein is patent on color Doppler imaging with normal direction of blood flow towards the liver. Other: None. IMPRESSION: 1. Dilated common bile duct which is concerning for choledocholithiasis. Recommend MRCP for further evaluation. 2. Mild gallbladder wall thickening and sludge with negative sonographic Murphy sign, findings are equivocal for acute cholecystitis. 3. Hepatic steatosis. Electronically Signed   By: Allegra Lai M.D.   On: 04/25/2022 09:17   Medications: I have reviewed the patient's current medications. Prior to Admission:   Medications Prior to Admission  Medication Sig Dispense Refill Last Dose   amLODipine (NORVASC) 2.5 MG tablet Take 2.5 mg by mouth daily.   04/25/2022   Apple Cider Vinegar 300 MG TABS Take 900 mg by mouth.   04/25/2022   Cholecalciferol 25 MCG (1000 UT) tablet Take 1,000 Units by mouth daily.   04/25/2022   Coenzyme Q10 (CO Q 10) 10 MG CAPS Take 10 mg by mouth daily.   04/25/2022   lisinopril (PRINIVIL,ZESTRIL) 20 MG tablet Take 20 mg by mouth daily.   04/25/2022   metoprolol succinate (TOPROL-XL) 25 MG 24 hr tablet Take 25 mg by mouth daily.   04/25/2022   Red Yeast Rice 600 MG CAPS Take 600 mg by mouth.   04/25/2022   Cod Liver Oil 5000-500 UNIT/5ML OIL Take 1 Dose by mouth daily. (Patient not taking: Reported on 04/25/2022)   Not Taking   DOCOSAHEXAENOIC ACID PO Take 1 tablet by mouth daily.      NON FORMULARY Take 1 Dose by mouth daily.      Scheduled:  amLODipine  2.5 mg Oral Daily   cholecalciferol  1,000 Units Oral Daily   enoxaparin (LOVENOX) injection  40 mg Subcutaneous Q24H   [START ON 04/30/2022] indocyanine green  2.5 mg Intravenous Once   lisinopril  20 mg Oral Daily   metoprolol succinate  25 mg Oral Daily   polyethylene glycol  17 g Oral Daily   Continuous:  cefTRIAXone (ROCEPHIN)  IV 2 g (04/26/22 1302)   lactated ringers 125 mL/hr at 04/26/22 0931   metronidazole 500 mg (04/26/22 1128)   ZOX:WRUEAVWU injection, ondansetron **OR** ondansetron (ZOFRAN) IV Anti-infectives (From admission, onward)    Start     Dose/Rate Route Frequency Ordered Stop   04/26/22 1015  metroNIDAZOLE (FLAGYL) IVPB 500 mg        500 mg 100 mL/hr over 60 Minutes Intravenous Every 12 hours 04/26/22 0923     04/25/22 1700  cefTRIAXone (ROCEPHIN) 2 g in sodium chloride 0.9 % 100 mL IVPB        2 g 200 mL/hr over 30 Minutes Intravenous Every 24 hours 04/25/22 1559        Scheduled Meds:  amLODipine  2.5 mg Oral Daily   cholecalciferol  1,000 Units Oral Daily   enoxaparin (LOVENOX) injection   40 mg Subcutaneous Q24H   [START ON 04/30/2022] indocyanine green  2.5 mg Intravenous Once   lisinopril  20 mg Oral Daily   metoprolol succinate  25 mg Oral Daily   polyethylene glycol  17 g Oral Daily   Continuous Infusions:  cefTRIAXone (  ROCEPHIN)  IV 2 g (04/26/22 1302)   lactated ringers 125 mL/hr at 04/26/22 0931   metronidazole 500 mg (04/26/22 1128)   PRN Meds:.morphine injection, ondansetron **OR** ondansetron (ZOFRAN) IV   Assessment: Principal Problem:   Cholelithiasis with choledocholithiasis Active Problems:   Hypertension   Acute cholecystitis   Post-ERCP acute pancreatitis   Plan: 88 year old female presented with right upper quadrant pain associated with nausea, imaging revealed cholelithiasis and dilated CBD 10 mm, she had elevated LFTs.  Patient underwent ERCP yesterday, cholangiogram could not be performed due to ampulla sitting within the diverticulum. Patient developed mild post-ERCP pancreatitis. Has been hemodynamically stable, no leukocytosis, serum lipase 525.   Mild post ERCP pancreatitis Tolerating clear liquid diet, continue the same Continue maintenance IV fluids for next 24 hours then stop General surgery has been consulted for cholecystectomy, tentative plan to perform cholecystectomy on Tuesday GI will sign off at this time, please call us back with questions or concerns   LOS: 1 day   Coleton Woon 04/26/2022, 3:19 PM

## 2022-04-27 DIAGNOSIS — R7303 Prediabetes: Secondary | ICD-10-CM

## 2022-04-27 DIAGNOSIS — K8070 Calculus of gallbladder and bile duct without cholecystitis without obstruction: Secondary | ICD-10-CM | POA: Diagnosis not present

## 2022-04-27 LAB — CBC
HCT: 37.6 % (ref 36.0–46.0)
Hemoglobin: 12.2 g/dL (ref 12.0–15.0)
MCH: 31.7 pg (ref 26.0–34.0)
MCHC: 32.4 g/dL (ref 30.0–36.0)
MCV: 97.7 fL (ref 80.0–100.0)
Platelets: 171 10*3/uL (ref 150–400)
RBC: 3.85 MIL/uL — ABNORMAL LOW (ref 3.87–5.11)
RDW: 12.9 % (ref 11.5–15.5)
WBC: 4.2 10*3/uL (ref 4.0–10.5)
nRBC: 0 % (ref 0.0–0.2)

## 2022-04-27 LAB — COMPREHENSIVE METABOLIC PANEL
ALT: 147 U/L — ABNORMAL HIGH (ref 0–44)
AST: 59 U/L — ABNORMAL HIGH (ref 15–41)
Albumin: 3.2 g/dL — ABNORMAL LOW (ref 3.5–5.0)
Alkaline Phosphatase: 102 U/L (ref 38–126)
Anion gap: 6 (ref 5–15)
BUN: 8 mg/dL (ref 8–23)
CO2: 25 mmol/L (ref 22–32)
Calcium: 8.5 mg/dL — ABNORMAL LOW (ref 8.9–10.3)
Chloride: 107 mmol/L (ref 98–111)
Creatinine, Ser: 0.56 mg/dL (ref 0.44–1.00)
GFR, Estimated: >60 mL/min (ref >60)
Glucose, Bld: 116 mg/dL — ABNORMAL HIGH (ref 70–99)
Potassium: 3.9 mmol/L (ref 3.5–5.1)
Sodium: 138 mmol/L (ref 135–145)
Total Bilirubin: 0.5 mg/dL (ref 0.3–1.2)
Total Protein: 6 g/dL — ABNORMAL LOW (ref 6.5–8.1)

## 2022-04-27 LAB — LIPASE, BLOOD: Lipase: 153 U/L — ABNORMAL HIGH (ref 11–51)

## 2022-04-27 MED ORDER — ENOXAPARIN SODIUM 40 MG/0.4ML IJ SOSY
40.0000 mg | PREFILLED_SYRINGE | INTRAMUSCULAR | Status: AC
  Administered 2022-04-28 – 2022-04-29 (×2): 40 mg via SUBCUTANEOUS
  Filled 2022-04-27 (×2): qty 0.4

## 2022-04-27 MED ORDER — AMLODIPINE BESYLATE 10 MG PO TABS
10.0000 mg | ORAL_TABLET | Freq: Every day | ORAL | Status: DC
  Administered 2022-04-28 – 2022-05-01 (×3): 10 mg via ORAL
  Filled 2022-04-27 (×3): qty 1

## 2022-04-27 MED ORDER — POLYETHYLENE GLYCOL 3350 17 G PO PACK: 17.0000 g | PACK | Freq: Every day | ORAL | Status: DC | PRN

## 2022-04-27 NOTE — Progress Notes (Addendum)
PROGRESS NOTE    Debra Gutierrez  [id number] DOB: 07/12/1930 DOA: 04/25/2022 PCP: Marinda Elk, MD  Outpatient Specialists: neurology    Brief Narrative:  From admission h and p Debra Gutierrez is a 88 y.o. female with medical history significant for hypertension, arthritis and irregular heartbeat who was referred to the ER by her primary care provider for evaluation after she had an abdominal ultrasound which came back abnormal. Patient states that she was in her usual state of health until 1 day prior to her admission when she developed severe right upper quadrant pain that she rated an 8 x 10 in intensity at its worst.  Pain was nonradiating and was associated with nausea but no emesis.  It subsided after a couple of hours.  She called her primary care provider who ordered a gallbladder ultrasound and it showed a dilated common bile duct which is concerning for Choledocholithiasis. Mild gallbladder wall thickening and sludge with negative sonographic Murphy sign, findings are equivocal for acute cholecystitis. Hepatic steatosis. She has not had any further episodes of abdominal pain and denies having any fever or chills. She denies having any cough,  no headache, no blurred vision, no dizziness, no lightheadedness, no chest pain, no shortness of breath, no leg swelling, no palpitations, no diaphoresis, no blurred vision no focal deficit.  Assessment & Plan:   Principal Problem:   Cholelithiasis with choledocholithiasis Active Problems:   Hypertension   Acute cholecystitis   Post-ERCP acute pancreatitis  # Cholelithiasis with possible choledocholithiasis # Cholecystitis # Post-ERCP pancreatitis Two recent bouts of ruq abdominal pain. Initial exam and u/s show possible cholecystitis. CBD dilated on u/s. ERCP unsuccessful. MRCP with dilated cbd, no stone seen. Suspect passed gallbladder stone. GI has signed off. Gen surg following. Today RUQ pain resolved, lipase  trending down. LFTs mildly elevated but also trending down. - stop IVF - continue clear liquid diet, gen surg wants maintained on that until surgery - ceftriaxone/metronidazole - plan right now is cholecystectomy on Tuesday 5/30  # HTN Bp mild elevation - cont home metop, lisinopril - increase amlodipine to 10  # Elevated glucose A1c 6.1%   DVT prophylaxis: lovenox Code Status: full Family Communication: daughter updated telephonically 5/27  Level of care: Med-Surg Status is: Inpatient Remains inpatient appropriate because: need for IV abx   Consultants:  GI, gen surg  Procedures: ERCP 5/25  Antimicrobials:  Ceftriaxone/metronidazole 5/26>    Subjective: no ruq pain. Has appetite. No n/v. No fever.  Objective: Vitals:   04/26/22 1534 04/26/22 2015 04/27/22 0435 04/27/22 0913  BP: (!) 143/58 (!) 155/78 (!) 161/68 (!) 154/73  Pulse: 67 64 60 81  Resp: 18 20 18 18   Temp: 98 F (36.7 C) 99.2 F (37.3 C) 98 F (36.7 C) 98.7 F (37.1 C)  TempSrc: Oral  Oral   SpO2: 96% 97% 94% 99%  Weight:      Height:        Intake/Output Summary (Last 24 hours) at 04/27/2022 1059 Last data filed at 04/26/2022 1849 Gross per 24 hour  Intake 952.38 ml  Output --  Net 952.38 ml   Filed Weights   04/25/22 1310  Weight: 59 kg    Examination:  General exam: Appears calm and comfortable  Respiratory system: Clear to auscultation. Respiratory effort normal. Cardiovascular system: S1 & S2 heard, RRR. No JVD, murmurs, rubs, gallops or clicks. No pedal edema. Gastrointestinal system: Abdomen is nondistended, no ruq tenderness. No organomegaly or masses felt. Normal  bowel sounds heard. Central nervous system: Alert and oriented. No focal neurological deficits. Extremities: Symmetric 5 x 5 power. Skin: No rashes, lesions or ulcers Psychiatry: Judgement and insight appear normal. Mood & affect appropriate.     Data Reviewed: I have personally reviewed following labs and  imaging studies  CBC: Recent Labs  Lab 04/20/22 1120 04/25/22 1311 04/26/22 0544 04/27/22 0500  WBC 4.2 4.7 10.1 4.2  HGB 14.4 13.9 12.8 12.2  HCT 44.0 42.3 38.7 37.6  MCV 97.8 97.5 95.6 97.7  PLT 222 223 188 [id number]   Basic Metabolic Panel: Recent Labs  Lab 04/20/22 1120 04/25/22 1311 04/26/22 0544 04/27/22 0500  NA 138 140 142 138  K 4.3 3.7 3.5 3.9  CL 105 105 111 107  CO2 27 28 24 25   GLUCOSE 118* 124* 131* 116*  BUN 17 13 14 8   CREATININE 0.79 1.00 0.81 0.56  CALCIUM 9.4 9.1 8.4* 8.5*   GFR: Estimated Creatinine Clearance: 38.7 mL/min (by C-G formula based on SCr of 0.56 mg/dL). Liver Function Tests: Recent Labs  Lab 04/20/22 1120 04/25/22 1311 04/26/22 0544 04/27/22 0500  AST 19 290* 144* 59*  ALT 18 307* 223* 147*  ALKPHOS 75 124 104 102  BILITOT 0.8 1.3* 1.0 0.5  PROT 7.5 7.5 6.1* 6.0*  ALBUMIN 4.4 4.4 3.5 3.2*   Recent Labs  Lab 04/25/22 1311 04/26/22 0544 04/27/22 0500  LIPASE 27 525* 153*   No results for input(s): AMMONIA in the last 168 hours. Coagulation Profile: No results for input(s): INR, PROTIME in the last 168 hours. Cardiac Enzymes: No results for input(s): CKTOTAL, CKMB, CKMBINDEX, TROPONINI in the last 168 hours. BNP (last 3 results) No results for input(s): PROBNP in the last 8760 hours. HbA1C: Recent Labs    04/26/22 0544  HGBA1C 6.1*   CBG: No results for input(s): GLUCAP in the last 168 hours. Lipid Profile: No results for input(s): CHOL, HDL, LDLCALC, TRIG, CHOLHDL, LDLDIRECT in the last 72 hours. Thyroid Function Tests: No results for input(s): TSH, T4TOTAL, FREET4, T3FREE, THYROIDAB in the last 72 hours. Anemia Panel: No results for input(s): VITAMINB12, FOLATE, FERRITIN, TIBC, IRON, RETICCTPCT in the last 72 hours. Urine analysis:    Component Value Date/Time   COLORURINE YELLOW (A) 04/25/2022 1535   APPEARANCEUR CLEAR (A) 04/25/2022 1535   LABSPEC 1.009 04/25/2022 1535   PHURINE 7.0 04/25/2022 1535    GLUCOSEU NEGATIVE 04/25/2022 1535   HGBUR NEGATIVE 04/25/2022 1535   BILIRUBINUR NEGATIVE 04/25/2022 1535   KETONESUR NEGATIVE 04/25/2022 1535   PROTEINUR NEGATIVE 04/25/2022 1535   NITRITE NEGATIVE 04/25/2022 1535   LEUKOCYTESUR NEGATIVE 04/25/2022 1535   Sepsis Labs: @LABRCNTIP (procalcitonin:4,lacticidven:4)  )No results found for this or any previous visit (from the past 240 hour(s)).       Radiology Studies: MR 3D Recon At Scanner  Result Date: 04/25/2022 CLINICAL DATA:  Right upper quadrant abdominal pain. Possible acute cholecystitis on ultrasound. Dilated common duct, raising the possibility of choledocholithiasis. EXAM: MRI ABDOMEN WITHOUT AND WITH CONTRAST (INCLUDING MRCP) TECHNIQUE: Multiplanar multisequence MR imaging of the abdomen was performed both before and after the administration of intravenous contrast. Heavily T2-weighted images of the biliary and pancreatic ducts were obtained, and three-dimensional MRCP images were rendered by post processing. CONTRAST:  40mL GADAVIST GADOBUTROL 1 MMOL/ML IV SOLN COMPARISON:  Right upper quadrant ultrasound dated 04/25/2022. CTA abdomen/pelvis dated 03/16/2022. FINDINGS: Motion degraded images. Lower chest: Lung bases are clear. Hepatobiliary: Scattered hepatic cysts, measuring up to 3.0 cm in segment 3 (  series 21/image 45). No suspicious/enhancing hepatic lesions. Layering tiny gallstones (series 4/image 21), with mild gallbladder wall thickening (series 4/image 26), but no convincing findings of acute cholecystitis. Very mild central intrahepatic ductal dilatation, similar to the prior. Dilated common duct, measuring 12 mm (series 3/image 22), previously 10 mm on CT. No choledocholithiasis is seen. Pancreas:  Within normal limits. Spleen:  Within normal limits. Adrenals/Urinary Tract:  Adrenal glands are within normal limits. Kidneys are within normal limits.  No hydronephrosis. Stomach/Bowel: Stomach and visualized bowel are grossly  unremarkable. Vascular/Lymphatic:  No evidence of abdominal aortic aneurysm. No suspicious abdominal lymphadenopathy. Other:  No abdominal ascites. Musculoskeletal: No focal osseous lesions. IMPRESSION: Motion degraded images. Cholelithiasis with mild gallbladder wall thickening, but without convincing findings of acute cholecystitis. Dilated common duct, measuring 12 mm, minimally progressive from prior CT. No choledocholithiasis is seen. Electronically Signed   By: Julian Hy M.D.   On: 04/25/2022 23:16   DG C-Arm 1-60 Min-No Report  Result Date: 04/25/2022 Fluoroscopy was utilized by the requesting physician.  No radiographic interpretation.   MR ABDOMEN MRCP W WO CONTAST  Result Date: 04/25/2022 CLINICAL DATA:  Right upper quadrant abdominal pain. Possible acute cholecystitis on ultrasound. Dilated common duct, raising the possibility of choledocholithiasis. EXAM: MRI ABDOMEN WITHOUT AND WITH CONTRAST (INCLUDING MRCP) TECHNIQUE: Multiplanar multisequence MR imaging of the abdomen was performed both before and after the administration of intravenous contrast. Heavily T2-weighted images of the biliary and pancreatic ducts were obtained, and three-dimensional MRCP images were rendered by post processing. CONTRAST:  64mL GADAVIST GADOBUTROL 1 MMOL/ML IV SOLN COMPARISON:  Right upper quadrant ultrasound dated 04/25/2022. CTA abdomen/pelvis dated 03/16/2022. FINDINGS: Motion degraded images. Lower chest: Lung bases are clear. Hepatobiliary: Scattered hepatic cysts, measuring up to 3.0 cm in segment 3 (series 21/image 45). No suspicious/enhancing hepatic lesions. Layering tiny gallstones (series 4/image 21), with mild gallbladder wall thickening (series 4/image 26), but no convincing findings of acute cholecystitis. Very mild central intrahepatic ductal dilatation, similar to the prior. Dilated common duct, measuring 12 mm (series 3/image 22), previously 10 mm on CT. No choledocholithiasis is seen.  Pancreas:  Within normal limits. Spleen:  Within normal limits. Adrenals/Urinary Tract:  Adrenal glands are within normal limits. Kidneys are within normal limits.  No hydronephrosis. Stomach/Bowel: Stomach and visualized bowel are grossly unremarkable. Vascular/Lymphatic:  No evidence of abdominal aortic aneurysm. No suspicious abdominal lymphadenopathy. Other:  No abdominal ascites. Musculoskeletal: No focal osseous lesions. IMPRESSION: Motion degraded images. Cholelithiasis with mild gallbladder wall thickening, but without convincing findings of acute cholecystitis. Dilated common duct, measuring 12 mm, minimally progressive from prior CT. No choledocholithiasis is seen. Electronically Signed   By: Julian Hy M.D.   On: 04/25/2022 23:16        Scheduled Meds:  [START ON 04/28/2022] amLODipine  10 mg Oral Daily   cholecalciferol  1,000 Units Oral Daily   enoxaparin (LOVENOX) injection  40 mg Subcutaneous Q24H   [START ON 04/30/2022] indocyanine green  2.5 mg Intravenous Once   lisinopril  20 mg Oral Daily   metoprolol succinate  25 mg Oral Daily   Continuous Infusions:  cefTRIAXone (ROCEPHIN)  IV Stopped (04/26/22 1332)   metronidazole 500 mg (04/27/22 1058)     LOS: 2 days     Desma Maxim, MD [facility]   If 7PM-7AM, please contact night-coverage [email or web address] Password [facility] 04/27/2022, 10:59 AM

## 2022-04-27 NOTE — Progress Notes (Addendum)
Subjective:  CC: Debra Gutierrez is a 88 y.o. female  Hospital stay day 2, 2 Days Post-Op ERCP for choledocholithiasis, acute cholecystitis  HPI: No acute issues overnight.  Patient states no abdominal pain present.  Tolerating clear liquid diet.  ROS:  General: Denies weight loss, weight gain, fatigue, fevers, chills, and night sweats. Heart: Denies chest pain, palpitations, racing heart, irregular heartbeat, leg pain or swelling, and decreased activity tolerance. Respiratory: Denies breathing difficulty, shortness of breath, wheezing, cough, and sputum. GI: Denies change in appetite, heartburn, nausea, vomiting, constipation, diarrhea, and blood in stool. GU: Denies difficulty urinating, pain with urinating, urgency, frequency, blood in urine.   Objective:   Temp:  [98 F (36.7 C)-99.2 F (37.3 C)] 98.7 F (37.1 C) (05/27 0913) Pulse Rate:  [60-81] 81 (05/27 0913) Resp:  [18-20] 18 (05/27 0913) BP: (143-161)/(58-78) 154/73 (05/27 0913) SpO2:  [94 %-99 %] 99 % (05/27 0913)     Height: 5\' 3"  (160 cm) Weight: 59 kg BMI (Calculated): 23.03   Intake/Output this shift:   Intake/Output Summary (Last 24 hours) at 04/27/2022 1036 Last data filed at 04/26/2022 1849 Gross per 24 hour  Intake 952.38 ml  Output --  Net 952.38 ml    Constitutional :  alert, cooperative, appears stated age, and no distress  Respiratory:  clear to auscultation bilaterally  Cardiovascular:  regular rate and rhythm  Gastrointestinal: soft, non-tender; bowel sounds normal; no masses,  no organomegaly.   Skin: Cool and moist.   Psychiatric: Normal affect, non-agitated, not confused       LABS:     Latest Ref Rng & Units 04/27/2022    5:00 AM 04/26/2022    5:44 AM 04/25/2022    1:11 PM  CMP  Glucose 70 - 99 mg/dL 04/27/2022   917   915    BUN 8 - 23 mg/dL 8   14   13     Creatinine 0.44 - 1.00 mg/dL 056     9.79    Sodium 135 - 145 mmol/L 138   142   140    Potassium 3.5 - 5.1 mmol/L 3.9   3.5   3.7     Chloride 98 - 111 mmol/L 107   111   105    CO2 22 - 32 mmol/L 25   24   28     Calcium 8.9 - 10.3 mg/dL 8.5   8.4   9.1    Total Protein 6.5 - 8.1 g/dL 6.0   6.1   7.5    Total Bilirubin 0.3 - 1.2 mg/dL 0.5   1.0   1.3    Alkaline Phos 38 - 126 U/L 102   104   124    AST 15 - 41 U/L 59   144   290    ALT 0 - 44 U/L 147   223   307        Latest Ref Rng & Units 04/27/2022    5:00 AM 04/26/2022    5:44 AM 04/25/2022    1:11 PM  CBC  WBC 4.0 - 10.5 K/uL 4.2   10.1   4.7    Hemoglobin 12.0 - 15.0 g/dL 04/29/2022   04/28/2022   04/27/2022    Hematocrit 36.0 - 46.0 % 37.6   38.7   42.3    Platelets 150 - 400 K/uL 171   188   223      RADS: N/a Assessment:   ERCP for choledocholithiasis, acute  cholecystitis.  Recovering well from recent ERCP, no acute exacerbation of her symptoms since admission.  Recommend continuing clear liquid diet to prevent any exacerbations until surgery.  Continue antibiotics as well for acute cholecystitis.  labs/images/medications/previous chart entries reviewed personally and relevant changes/updates noted above.

## 2022-04-28 DIAGNOSIS — K8070 Calculus of gallbladder and bile duct without cholecystitis without obstruction: Secondary | ICD-10-CM | POA: Diagnosis not present

## 2022-04-28 MED ORDER — HYDROCHLOROTHIAZIDE 25 MG PO TABS
25.0000 mg | ORAL_TABLET | Freq: Every day | ORAL | Status: DC
  Administered 2022-04-28 – 2022-05-01 (×3): 25 mg via ORAL
  Filled 2022-04-28 (×3): qty 1

## 2022-04-28 NOTE — Progress Notes (Signed)
PROGRESS NOTE    Debra Gutierrez  UJW:[id number] DOB: 01/01/1930 DOA: 04/25/2022 PCP: Patrice ParadiseMcLaughlin, Miriam K, MD  Outpatient Specialists: neurology    Brief Narrative:  From admission h and p Debra FlockBernease W Gutierrez is a 88 y.o. female with medical history significant for hypertension, arthritis and irregular heartbeat who was referred to the ER by her primary care provider for evaluation after she had an abdominal ultrasound which came back abnormal. Patient states that she was in her usual state of health until 1 day prior to her admission when she developed severe right upper quadrant pain that she rated an 8 x 10 in intensity at its worst.  Pain was nonradiating and was associated with nausea but no emesis.  It subsided after a couple of hours.  She called her primary care provider who ordered a gallbladder ultrasound and it showed a dilated common bile duct which is concerning for Choledocholithiasis. Mild gallbladder wall thickening and sludge with negative sonographic Murphy sign, findings are equivocal for acute cholecystitis. Hepatic steatosis. She has not had any further episodes of abdominal pain and denies having any fever or chills. She denies having any cough,  no headache, no blurred vision, no dizziness, no lightheadedness, no chest pain, no shortness of breath, no leg swelling, no palpitations, no diaphoresis, no blurred vision no focal deficit.  Assessment & Plan:   Principal Problem:   Cholelithiasis with choledocholithiasis Active Problems:   Hypertension   Acute cholecystitis   Post-ERCP acute pancreatitis   Prediabetes  # Cholelithiasis with possible choledocholithiasis # Cholecystitis # Post-ERCP pancreatitis Two recent bouts of ruq abdominal pain. Initial exam and u/s show possible cholecystitis. CBD dilated on u/s. ERCP unsuccessful. MRCP with dilated cbd, no stone seen. Suspect passed gallbladder stone. GI has signed off. Gen surg following. Today RUQ pain  resolved, lipase trending down. LFTs mildly elevated but also trending down. In terms of cardiac risk she has no history of CAD/CHF. TTE  6 months ago showed no significant problems. She is independent in IADLs. Do not think she needs perioperative cardiac w/u. - continue clear liquid diet, gen surg wants maintained on that until surgery - cont ceftriaxone/metronidazole - plan right now is cholecystectomy on Tuesday 5/30  # HTN Bp mod elevation - cont home metop, lisinopril - increased amlodipine to 10 - add hctz  # Elevated glucose A1c 6.1%   DVT prophylaxis: lovenox Code Status: full Family Communication: daughter updated telephonically 5/28. Shared decision to not call tomorrow assuming no changes.  Level of care: Med-Surg Status is: Inpatient Remains inpatient appropriate because: need for IV abx and surgery   Consultants:  GI, gen surg  Procedures: ERCP 5/25  Antimicrobials:  Ceftriaxone/metronidazole 5/26>    Subjective: no ruq pain. Has appetite. No n/v. No fever.  Objective: Vitals:   04/27/22 0913 04/27/22 1631 04/27/22 1927 04/28/22 1000  BP: (!) 154/73 (!) 166/89 (!) 161/69 (!) 175/86  Pulse: 81 76 69 88  Resp: 18 18 18 20   Temp: 98.7 F (37.1 C) 98.8 F (37.1 C) 97.9 F (36.6 C) 98.2 F (36.8 C)  TempSrc:  Oral Oral   SpO2: 99% 97% 95% 99%  Weight:      Height:        Intake/Output Summary (Last 24 hours) at 04/28/2022 1324 Last data filed at 04/27/2022 1919 Gross per 24 hour  Intake 240 ml  Output --  Net 240 ml   Filed Weights   04/25/22 1310  Weight: 59 kg  Examination:  General exam: Appears calm and comfortable  Respiratory system: Clear to auscultation. Respiratory effort normal. Cardiovascular system: S1 & S2 heard, RRR. No JVD, murmurs, rubs, gallops or clicks. No pedal edema. Gastrointestinal system: Abdomen is nondistended, no ruq tenderness. No organomegaly or masses felt. Normal bowel sounds heard. Central nervous system:  Alert and oriented. No focal neurological deficits. Extremities: Symmetric 5 x 5 power. Skin: No rashes, lesions or ulcers Psychiatry: Judgement and insight appear normal. Mood & affect appropriate.     Data Reviewed: I have personally reviewed following labs and imaging studies  CBC: Recent Labs  Lab 04/25/22 1311 04/26/22 0544 04/27/22 0500  WBC 4.7 10.1 4.2  HGB 13.9 12.8 12.2  HCT 42.3 38.7 37.6  MCV 97.5 95.6 97.7  PLT 223 188 171   Basic Metabolic Panel: Recent Labs  Lab 04/25/22 1311 04/26/22 0544 04/27/22 0500  NA 140 142 138  K 3.7 3.5 3.9  CL 105 111 107  CO2 28 24 25   GLUCOSE 124* 131* 116*  BUN 13 14 8   CREATININE 1.00 0.81 0.56  CALCIUM 9.1 8.4* 8.5*   GFR: Estimated Creatinine Clearance: 38.7 mL/min (by C-G formula based on SCr of 0.56 mg/dL). Liver Function Tests: Recent Labs  Lab 04/25/22 1311 04/26/22 0544 04/27/22 0500  AST 290* 144* 59*  ALT 307* 223* 147*  ALKPHOS 124 104 102  BILITOT 1.3* 1.0 0.5  PROT 7.5 6.1* 6.0*  ALBUMIN 4.4 3.5 3.2*   Recent Labs  Lab 04/25/22 1311 04/26/22 0544 04/27/22 0500  LIPASE 27 525* 153*   No results for input(s): AMMONIA in the last 168 hours. Coagulation Profile: No results for input(s): INR, PROTIME in the last 168 hours. Cardiac Enzymes: No results for input(s): CKTOTAL, CKMB, CKMBINDEX, TROPONINI in the last 168 hours. BNP (last 3 results) No results for input(s): PROBNP in the last 8760 hours. HbA1C: Recent Labs    04/26/22 0544  HGBA1C 6.1*   CBG: No results for input(s): GLUCAP in the last 168 hours. Lipid Profile: No results for input(s): CHOL, HDL, LDLCALC, TRIG, CHOLHDL, LDLDIRECT in the last 72 hours. Thyroid Function Tests: No results for input(s): TSH, T4TOTAL, FREET4, T3FREE, THYROIDAB in the last 72 hours. Anemia Panel: No results for input(s): VITAMINB12, FOLATE, FERRITIN, TIBC, IRON, RETICCTPCT in the last 72 hours. Urine analysis:    Component Value Date/Time    COLORURINE YELLOW (A) 04/25/2022 1535   APPEARANCEUR CLEAR (A) 04/25/2022 1535   LABSPEC 1.009 04/25/2022 1535   PHURINE 7.0 04/25/2022 1535   GLUCOSEU NEGATIVE 04/25/2022 1535   HGBUR NEGATIVE 04/25/2022 1535   BILIRUBINUR NEGATIVE 04/25/2022 1535   KETONESUR NEGATIVE 04/25/2022 1535   PROTEINUR NEGATIVE 04/25/2022 1535   NITRITE NEGATIVE 04/25/2022 1535   LEUKOCYTESUR NEGATIVE 04/25/2022 1535   Sepsis Labs: @LABRCNTIP (procalcitonin:4,lacticidven:4)  )No results found for this or any previous visit (from the past 240 hour(s)).       Radiology Studies: No results found.      Scheduled Meds:  amLODipine  10 mg Oral Daily   cholecalciferol  1,000 Units Oral Daily   enoxaparin (LOVENOX) injection  40 mg Subcutaneous Q24H   hydrochlorothiazide  25 mg Oral Daily   [START ON 04/30/2022] indocyanine green  2.5 mg Intravenous Once   lisinopril  20 mg Oral Daily   metoprolol succinate  25 mg Oral Daily   Continuous Infusions:  cefTRIAXone (ROCEPHIN)  IV 2 g (04/28/22 1200)   metronidazole 500 mg (04/28/22 1232)     LOS: 3  days     Silvano Bilis, MD [facility]   If 7PM-7AM, please contact night-coverage [email or web address] Password [email or web address] 04/28/2022, 1:24 PM

## 2022-04-28 NOTE — TOC Initial Note (Signed)
Transition of Care [facility]) - Initial/Assessment Note    Patient Details  Name: Debra Gutierrez MRN: [id number] Date of Birth: Apr 21, 1930  [facility] of [facility]) CM/SW Contact:    Liliana Cline, LCSW Phone Number: 04/28/2022, 11:16 AM  Clinical Narrative:               CSW spoke with patient regarding DC planning. Patient lives alone and drives herself to appointments. PCP is Dr. Merlinda Frederick. Pharmacy is [facility] [address]. Patient has a cane and RW she can use if needed, she stated they are old. Patient is agreeable to OPPT "if they think I need it". She stated she went to [facility] in the past and would prefer to go there again since she knows where it is located. TOC handoff updated to provide patient with [facility] contact information for scheduling closer to DC.    Expected Discharge Plan: OP Rehab Barriers to Discharge: Continued Medical Work up   Patient Goals and CMS Choice Patient states their goals for this hospitalization and ongoing recovery are:: agreeable to OPPT CMS Medicare.gov Compare Post Acute Care list provided to:: Patient Choice offered to / list presented to : Patient  Expected Discharge Plan and Services Expected Discharge Plan: OP Rehab       Living arrangements for the past 2 months: Single Family Home                                      Prior Living Arrangements/Services Living arrangements for the past 2 months: Single Family Home Lives with:: Self Patient language and need for interpreter reviewed:: Yes Do you feel safe going back to the place where you live?: Yes      Need for Family Participation in Patient Care: Yes (Comment) Care giver support system in place?: Yes (comment) Current home services: DME Criminal Activity/Legal Involvement Pertinent to Current Situation/Hospitalization: No - Comment as needed  Activities of Daily Living Home Assistive Devices/Equipment: Eyeglasses ADL Screening (condition at time of  admission) Patient's cognitive ability adequate to safely complete daily activities?: Yes Is the patient deaf or have difficulty hearing?: No Does the patient have difficulty seeing, even when wearing glasses/contacts?: No Does the patient have difficulty concentrating, remembering, or making decisions?: No Patient able to express need for assistance with ADLs?: Yes Does the patient have difficulty dressing or bathing?: No Independently performs ADLs?: Yes (appropriate for developmental age) Does the patient have difficulty walking or climbing stairs?: No Weakness of Legs: None Weakness of Arms/Hands: None  Permission Sought/Granted Permission sought to share information with : Facility Industrial/product designer granted to share information with : Yes, Verbal Permission Granted     Permission granted to share info w AGENCY: OPPT        Emotional Assessment       Orientation: : Oriented to Self, Oriented to Place, Oriented to  Time, Oriented to Situation Alcohol / Substance Use: Not Applicable Psych Involvement: No (comment)  Admission diagnosis:  Cholelithiasis with choledocholithiasis [K80.70] Patient Active Problem List   Diagnosis Date Noted   Prediabetes 04/27/2022   Acute cholecystitis 04/26/2022   Post-ERCP acute pancreatitis 04/26/2022   Cholelithiasis with choledocholithiasis 04/25/2022   Hypertension    Premature atrial complex 10/10/2021   Osteoporosis 03/08/2015   History of total knee arthroplasty, left 05/19/2014   Trigeminal neuralgia of left side of face 09/08/2013   PCP:  Maurine Minister  Kirtland Bouchard, MD Pharmacy:   [facility] [address] [place] - [address] [place] [address] Phone: [phone] Fax: [phone]     Social Determinants of Health (SDOH) Interventions    Readmission Risk Interventions     View : No data to display.

## 2022-04-28 NOTE — Plan of Care (Signed)
Pt AAOx4, no pain. Tolerating clear diet. Ambulates independently in room. Plan for surgery Tuesday. VS are stable. Bed in lowest position, call light within reach. Will continue to monitor.

## 2022-04-29 DIAGNOSIS — K8070 Calculus of gallbladder and bile duct without cholecystitis without obstruction: Secondary | ICD-10-CM | POA: Diagnosis not present

## 2022-04-29 LAB — BASIC METABOLIC PANEL
Anion gap: 6 (ref 5–15)
BUN: 7 mg/dL — ABNORMAL LOW (ref 8–23)
CO2: 26 mmol/L (ref 22–32)
Calcium: 8.9 mg/dL (ref 8.9–10.3)
Chloride: 107 mmol/L (ref 98–111)
Creatinine, Ser: 0.66 mg/dL (ref 0.44–1.00)
GFR, Estimated: >60 mL/min (ref >60)
Glucose, Bld: 120 mg/dL — ABNORMAL HIGH (ref 70–99)
Potassium: 3.8 mmol/L (ref 3.5–5.1)
Sodium: 139 mmol/L (ref 135–145)

## 2022-04-29 MED ORDER — DEXTROSE IN LACTATED RINGERS 5 % IV SOLN: INTRAVENOUS | Status: AC

## 2022-04-29 NOTE — Care Management Important Message (Signed)
Important Message  Patient Details  Name: JAZMERE STAILEY MRN: [id number] Date of Birth: [birth year]   Medicare Important Message Given:  Yes     Dannette Barbara 04/29/2022, 12:38 PM

## 2022-04-29 NOTE — Plan of Care (Signed)
Pt AAOx4, no pain. Tolerating clear diet. Ambulates independently in room/hall. Plan for surgery Tuesday, NPO at midnight. VS are stable. Bed in lowest position, call light within reach. Will continue to monitor.

## 2022-04-29 NOTE — Progress Notes (Signed)
Mobility Specialist - Progress Note   04/29/22 1500  Mobility  Activity Ambulated with assistance in hallway  Level of Assistance Standby assist, set-up cues, supervision of patient - no hands on  Assistive Device None  Distance Ambulated (ft) 210 ft  Activity Response Tolerated well  $Mobility charge 1 Mobility     Pt ambulated in hallway with supervision. No complaints. Returned to bed with family entering upon exit, needs in reach.    Filiberto Pinks Mobility Specialist 04/29/22, 3:07 PM

## 2022-04-29 NOTE — Progress Notes (Signed)
PROGRESS NOTE    Debra Gutierrez  NOB:[id number] DOB: 11/16/1930 DOA: 04/25/2022 PCP: Patrice Paradise, MD  Outpatient Specialists: neurology    Brief Narrative:  From admission h and p Debra Gutierrez is a 88 y.o. female with medical history significant for hypertension, arthritis and irregular heartbeat who was referred to the ER by her primary care provider for evaluation after she had an abdominal ultrasound which came back abnormal. Patient states that she was in her usual state of health until 1 day prior to her admission when she developed severe right upper quadrant pain that she rated an 8 x 10 in intensity at its worst.  Pain was nonradiating and was associated with nausea but no emesis.  It subsided after a couple of hours.  She called her primary care provider who ordered a gallbladder ultrasound and it showed a dilated common bile duct which is concerning for Choledocholithiasis. Mild gallbladder wall thickening and sludge with negative sonographic Murphy sign, findings are equivocal for acute cholecystitis. Hepatic steatosis. She has not had any further episodes of abdominal pain and denies having any fever or chills. She denies having any cough,  no headache, no blurred vision, no dizziness, no lightheadedness, no chest pain, no shortness of breath, no leg swelling, no palpitations, no diaphoresis, no blurred vision no focal deficit.  Assessment & Plan:   Principal Problem:   Cholelithiasis with choledocholithiasis Active Problems:   Hypertension   Acute cholecystitis   Post-ERCP acute pancreatitis   Prediabetes  # Cholelithiasis with possible choledocholithiasis # Cholecystitis # Post-ERCP pancreatitis Two recent bouts of ruq abdominal pain. Initial exam and u/s show possible cholecystitis. CBD dilated on u/s. ERCP unsuccessful. MRCP with dilated cbd, no stone seen. Suspect passed gallbladder stone. GI has signed off. Gen surg following. Today RUQ pain  resolved, lipase trending down. LFTs mildly elevated but also trending down. In terms of cardiac risk she has no history of CAD/CHF. TTE  6 months ago showed no significant problems. She is independent in IADLs. Do not think she needs perioperative cardiac w/u. - continue clear liquid diet, npo at midnight, cholecystectomy tomorrow - cont ceftriaxone/metronidazole  # HTN Bp wnl - cont home metop, lisinopril - increased amlodipine to 10 - added hctz  # Elevated glucose A1c 6.1%   DVT prophylaxis: lovenox Code Status: full Family Communication: daughter updated @ bedside 5/29  Level of care: Med-Surg Status is: Inpatient Remains inpatient appropriate because: need for IV abx and surgery   Consultants:  GI, gen surg  Procedures: ERCP 5/25  Antimicrobials:  Ceftriaxone/metronidazole 5/26>    Subjective: no ruq pain. Has appetite. No n/v. No fever.  Objective: Vitals:   04/28/22 1624 04/28/22 1943 04/29/22 0409 04/29/22 0819  BP: 117/73 (!) 157/88 140/71 136/81  Pulse: 84 68 (!) 59 86  Resp: 16 16 16 16   Temp: 98.1 F (36.7 C) 97.9 F (36.6 C) 98 F (36.7 C) 98.1 F (36.7 C)  TempSrc: Oral Oral Oral Oral  SpO2: 97% 97% 98% 99%  Weight:      Height:        Intake/Output Summary (Last 24 hours) at 04/29/2022 1426 Last data filed at 04/29/2022 1014 Gross per 24 hour  Intake 780 ml  Output --  Net 780 ml   Filed Weights   04/25/22 1310  Weight: 59 kg    Examination:  General exam: Appears calm and comfortable  Respiratory system: Clear to auscultation. Respiratory effort normal. Cardiovascular system: S1 & S2 heard,  RRR. No JVD, murmurs, rubs, gallops or clicks. No pedal edema. Gastrointestinal system: Abdomen is nondistended, no ruq tenderness. No organomegaly or masses felt. Normal bowel sounds heard. Central nervous system: Alert and oriented. No focal neurological deficits. Extremities: Symmetric 5 x 5 power. Skin: No rashes, lesions or  ulcers Psychiatry: Judgement and insight appear normal. Mood & affect appropriate.     Data Reviewed: I have personally reviewed following labs and imaging studies  CBC: Recent Labs  Lab 04/25/22 1311 04/26/22 0544 04/27/22 0500  WBC 4.7 10.1 4.2  HGB 13.9 12.8 12.2  HCT 42.3 38.7 37.6  MCV 97.5 95.6 97.7  PLT 223 188 171   Basic Metabolic Panel: Recent Labs  Lab 04/25/22 1311 04/26/22 0544 04/27/22 0500 04/29/22 0307  NA 140 142 138 139  K 3.7 3.5 3.9 3.8  CL 105 111 107 107  CO2 28 24 25 26   GLUCOSE 124* 131* 116* 120*  BUN 13 14 8  7*  CREATININE 1.00 0.81 0.56 0.66  CALCIUM 9.1 8.4* 8.5* 8.9   GFR: Estimated Creatinine Clearance: 38.7 mL/min (by C-G formula based on SCr of 0.66 mg/dL). Liver Function Tests: Recent Labs  Lab 04/25/22 1311 04/26/22 0544 04/27/22 0500  AST 290* 144* 59*  ALT 307* 223* 147*  ALKPHOS 124 104 102  BILITOT 1.3* 1.0 0.5  PROT 7.5 6.1* 6.0*  ALBUMIN 4.4 3.5 3.2*   Recent Labs  Lab 04/25/22 1311 04/26/22 0544 04/27/22 0500  LIPASE 27 525* 153*   No results for input(s): AMMONIA in the last 168 hours. Coagulation Profile: No results for input(s): INR, PROTIME in the last 168 hours. Cardiac Enzymes: No results for input(s): CKTOTAL, CKMB, CKMBINDEX, TROPONINI in the last 168 hours. BNP (last 3 results) No results for input(s): PROBNP in the last 8760 hours. HbA1C: No results for input(s): HGBA1C in the last 72 hours.  CBG: No results for input(s): GLUCAP in the last 168 hours. Lipid Profile: No results for input(s): CHOL, HDL, LDLCALC, TRIG, CHOLHDL, LDLDIRECT in the last 72 hours. Thyroid Function Tests: No results for input(s): TSH, T4TOTAL, FREET4, T3FREE, THYROIDAB in the last 72 hours. Anemia Panel: No results for input(s): VITAMINB12, FOLATE, FERRITIN, TIBC, IRON, RETICCTPCT in the last 72 hours. Urine analysis:    Component Value Date/Time   COLORURINE YELLOW (A) 04/25/2022 1535   APPEARANCEUR CLEAR (A)  04/25/2022 1535   LABSPEC 1.009 04/25/2022 1535   PHURINE 7.0 04/25/2022 1535   GLUCOSEU NEGATIVE 04/25/2022 1535   HGBUR NEGATIVE 04/25/2022 1535   BILIRUBINUR NEGATIVE 04/25/2022 1535   KETONESUR NEGATIVE 04/25/2022 1535   PROTEINUR NEGATIVE 04/25/2022 1535   NITRITE NEGATIVE 04/25/2022 1535   LEUKOCYTESUR NEGATIVE 04/25/2022 1535   Sepsis Labs: @LABRCNTIP (procalcitonin:4,lacticidven:4)  )No results found for this or any previous visit (from the past 240 hour(s)).       Radiology Studies: No results found.      Scheduled Meds:  amLODipine  10 mg Oral Daily   cholecalciferol  1,000 Units Oral Daily   hydrochlorothiazide  25 mg Oral Daily   [START ON 04/30/2022] indocyanine green  2.5 mg Intravenous Once   lisinopril  20 mg Oral Daily   metoprolol succinate  25 mg Oral Daily   Continuous Infusions:  cefTRIAXone (ROCEPHIN)  IV 2 g (04/29/22 1135)   metronidazole 500 mg (04/29/22 1239)     LOS: 4 days     05/02/2022, MD [facility]   If 7PM-7AM, please contact night-coverage [email or web address] Password [email or web address] 04/29/2022, 2:26 PM

## 2022-04-30 ENCOUNTER — Inpatient Hospital Stay: Payer: PPO | Admitting: Certified Registered"

## 2022-04-30 ENCOUNTER — Encounter: Payer: Self-pay | Admitting: Internal Medicine

## 2022-04-30 ENCOUNTER — Encounter
Admission: EM | Disposition: A | Discharge status: Home / Self Care | Payer: Self-pay | Source: Home / Self Care | Attending: Obstetrics and Gynecology

## 2022-04-30 DIAGNOSIS — K812 Acute cholecystitis with chronic cholecystitis: Secondary | ICD-10-CM

## 2022-04-30 DIAGNOSIS — K8070 Calculus of gallbladder and bile duct without cholecystitis without obstruction: Secondary | ICD-10-CM | POA: Diagnosis not present

## 2022-04-30 DIAGNOSIS — K430 Incisional hernia with obstruction, without gangrene: Secondary | ICD-10-CM | POA: Diagnosis not present

## 2022-04-30 HISTORY — PX: UMBILICAL HERNIA REPAIR: SHX196

## 2022-04-30 LAB — BASIC METABOLIC PANEL
Anion gap: 6 (ref 5–15)
BUN: 9 mg/dL (ref 8–23)
CO2: 28 mmol/L (ref 22–32)
Calcium: 8.9 mg/dL (ref 8.9–10.3)
Chloride: 104 mmol/L (ref 98–111)
Creatinine, Ser: 0.87 mg/dL (ref 0.44–1.00)
GFR, Estimated: >60 mL/min (ref >60)
Glucose, Bld: 143 mg/dL — ABNORMAL HIGH (ref 70–99)
Potassium: 3.8 mmol/L (ref 3.5–5.1)
Sodium: 138 mmol/L (ref 135–145)

## 2022-04-30 SURGERY — CHOLECYSTECTOMY, ROBOT-ASSISTED, LAPAROSCOPIC
Anesthesia: General

## 2022-04-30 MED ORDER — PROPOFOL 10 MG/ML IV BOLUS
INTRAVENOUS | Status: DC | PRN
  Administered 2022-04-30: 100 mg via INTRAVENOUS

## 2022-04-30 MED ORDER — TRAMADOL HCL 50 MG PO TABS
50.0000 mg | ORAL_TABLET | Freq: Four times a day (QID) | ORAL | Status: DC
  Administered 2022-04-30 (×3): 50 mg via ORAL
  Filled 2022-04-30 (×3): qty 1

## 2022-04-30 MED ORDER — MIDAZOLAM HCL 2 MG/2ML IJ SOLN
INTRAMUSCULAR | Status: AC
  Filled 2022-04-30: qty 2

## 2022-04-30 MED ORDER — PROPOFOL 1000 MG/100ML IV EMUL
INTRAVENOUS | Status: AC
Start: 2022-04-30 — End: ?
  Filled 2022-04-30: qty 100

## 2022-04-30 MED ORDER — LACTATED RINGERS IV SOLN: INTRAVENOUS | Status: DC | PRN

## 2022-04-30 MED ORDER — BUPIVACAINE-EPINEPHRINE (PF) 0.25% -1:200000 IJ SOLN
INTRAMUSCULAR | Status: DC | PRN
  Administered 2022-04-30: 30 mL

## 2022-04-30 MED ORDER — ONDANSETRON HCL 4 MG/2ML IJ SOLN
INTRAMUSCULAR | Status: DC | PRN
  Administered 2022-04-30: 4 mg via INTRAVENOUS

## 2022-04-30 MED ORDER — BUPIVACAINE LIPOSOME 1.3 % IJ SUSP
INTRAMUSCULAR | Status: DC | PRN
  Administered 2022-04-30: 20 mL

## 2022-04-30 MED ORDER — LIDOCAINE HCL (CARDIAC) PF 100 MG/5ML IV SOSY
PREFILLED_SYRINGE | INTRAVENOUS | Status: DC | PRN
  Administered 2022-04-30: 80 mg via INTRAVENOUS

## 2022-04-30 MED ORDER — ROCURONIUM BROMIDE 100 MG/10ML IV SOLN
INTRAVENOUS | Status: DC | PRN
  Administered 2022-04-30: 60 mg via INTRAVENOUS
  Administered 2022-04-30: 30 mg via INTRAVENOUS

## 2022-04-30 MED ORDER — SODIUM CHLORIDE 0.9 % IV SOLN: INTRAVENOUS | Status: DC | PRN

## 2022-04-30 MED ORDER — FENTANYL CITRATE (PF) 100 MCG/2ML IJ SOLN
INTRAMUSCULAR | Status: AC
  Administered 2022-04-30: 25 ug via INTRAVENOUS
  Filled 2022-04-30: qty 2

## 2022-04-30 MED ORDER — FENTANYL CITRATE (PF) 100 MCG/2ML IJ SOLN
25.0000 ug | INTRAMUSCULAR | Status: AC | PRN
  Administered 2022-04-30 (×6): 25 ug via INTRAVENOUS

## 2022-04-30 MED ORDER — ACETAMINOPHEN 500 MG PO TABS
1000.0000 mg | ORAL_TABLET | Freq: Four times a day (QID) | ORAL | Status: DC
  Administered 2022-05-01: 1000 mg via ORAL
  Filled 2022-04-30 (×2): qty 2

## 2022-04-30 MED ORDER — VASOPRESSIN 20 UNIT/ML IV SOLN
INTRAVENOUS | Status: DC | PRN
  Administered 2022-04-30: 2 [IU] via INTRAVENOUS

## 2022-04-30 MED ORDER — ACETAMINOPHEN 10 MG/ML IV SOLN
1000.0000 mg | Freq: Once | INTRAVENOUS | Status: AC
Start: 2022-04-30 — End: 2022-04-30
  Administered 2022-04-30: 1000 mg via INTRAVENOUS

## 2022-04-30 MED ORDER — PHENYLEPHRINE HCL-NACL 20-0.9 MG/250ML-% IV SOLN
INTRAVENOUS | Status: DC | PRN
  Administered 2022-04-30: 50 ug/min via INTRAVENOUS

## 2022-04-30 MED ORDER — KETOROLAC TROMETHAMINE 15 MG/ML IJ SOLN
15.0000 mg | Freq: Once | INTRAMUSCULAR | Status: AC
Start: 2022-04-30 — End: 2022-04-30
  Administered 2022-04-30: 15 mg via INTRAVENOUS

## 2022-04-30 MED ORDER — ACETAMINOPHEN 10 MG/ML IV SOLN
INTRAVENOUS | Status: AC
  Filled 2022-04-30: qty 100

## 2022-04-30 MED ORDER — FENTANYL CITRATE (PF) 100 MCG/2ML IJ SOLN
INTRAMUSCULAR | Status: AC
Start: 2022-04-30 — End: ?
  Filled 2022-04-30: qty 2

## 2022-04-30 MED ORDER — OXYCODONE HCL 5 MG PO TABS: 5.0000 mg | ORAL_TABLET | Freq: Four times a day (QID) | ORAL | Status: DC | PRN

## 2022-04-30 MED ORDER — DEXAMETHASONE SODIUM PHOSPHATE 10 MG/ML IJ SOLN
INTRAMUSCULAR | Status: DC | PRN
  Administered 2022-04-30: 8 mg via INTRAVENOUS

## 2022-04-30 MED ORDER — BUPIVACAINE LIPOSOME 1.3 % IJ SUSP
INTRAMUSCULAR | Status: AC
  Filled 2022-04-30: qty 20

## 2022-04-30 MED ORDER — FENTANYL CITRATE (PF) 100 MCG/2ML IJ SOLN
INTRAMUSCULAR | Status: DC | PRN
  Administered 2022-04-30: 75 ug via INTRAVENOUS
  Administered 2022-04-30: 25 ug via INTRAVENOUS

## 2022-04-30 MED ORDER — ENOXAPARIN SODIUM 40 MG/0.4ML IJ SOSY
40.0000 mg | PREFILLED_SYRINGE | INTRAMUSCULAR | Status: DC
  Administered 2022-05-01: 40 mg via SUBCUTANEOUS
  Filled 2022-04-30: qty 0.4

## 2022-04-30 MED ORDER — BUPIVACAINE-EPINEPHRINE (PF) 0.25% -1:200000 IJ SOLN
INTRAMUSCULAR | Status: AC
  Filled 2022-04-30: qty 30

## 2022-04-30 MED ORDER — KETOROLAC TROMETHAMINE 15 MG/ML IJ SOLN
INTRAMUSCULAR | Status: AC
  Filled 2022-04-30: qty 1

## 2022-04-30 MED ORDER — SUGAMMADEX SODIUM 200 MG/2ML IV SOLN
INTRAVENOUS | Status: DC | PRN
  Administered 2022-04-30: 240 mg via INTRAVENOUS

## 2022-04-30 MED ORDER — SEVOFLURANE IN SOLN
RESPIRATORY_TRACT | Status: AC
  Filled 2022-04-30: qty 250

## 2022-04-30 MED ORDER — ONDANSETRON HCL 4 MG/2ML IJ SOLN: 4.0000 mg | Freq: Once | INTRAMUSCULAR | Status: DC | PRN

## 2022-04-30 MED ORDER — EPHEDRINE SULFATE (PRESSORS) 50 MG/ML IJ SOLN
INTRAMUSCULAR | Status: DC | PRN
  Administered 2022-04-30: 10 mg via INTRAVENOUS

## 2022-04-30 MED ORDER — MIDAZOLAM HCL 2 MG/2ML IJ SOLN
INTRAMUSCULAR | Status: DC | PRN
  Administered 2022-04-30: 1 mg via INTRAVENOUS

## 2022-04-30 SURGICAL SUPPLY — 52 items
BAG RETRIEVAL 10 (BASKET) ×1
CANNULA REDUC XI 12-8 STAPL (CANNULA) ×1
CANNULA REDUCER 12-8 DVNC XI (CANNULA) ×2 IMPLANT
CATH REDDICK CHOLANGI 4FR 50CM (CATHETERS) IMPLANT
CLIP LIGATING HEM O LOK PURPLE (MISCELLANEOUS) ×1 IMPLANT
CLIP LIGATING HEMO O LOK GREEN (MISCELLANEOUS) ×3 IMPLANT
DERMABOND ADVANCED (GAUZE/BANDAGES/DRESSINGS) ×1
DERMABOND ADVANCED .7 DNX12 (GAUZE/BANDAGES/DRESSINGS) ×2 IMPLANT
DRAPE ARM DVNC X/XI (DISPOSABLE) ×8 IMPLANT
DRAPE COLUMN DVNC XI (DISPOSABLE) ×2 IMPLANT
DRAPE DA VINCI XI ARM (DISPOSABLE) ×4
DRAPE DA VINCI XI COLUMN (DISPOSABLE) ×1
ELECT CAUTERY BLADE 6.4 (BLADE) ×3 IMPLANT
ELECT REM PT RETURN 9FT ADLT (ELECTROSURGICAL) ×3
ELECTRODE REM PT RTRN 9FT ADLT (ELECTROSURGICAL) ×2 IMPLANT
GLOVE BIO SURGEON STRL SZ7 (GLOVE) ×10 IMPLANT
GOWN STRL REUS W/ TWL LRG LVL3 (GOWN DISPOSABLE) ×8 IMPLANT
GOWN STRL REUS W/TWL LRG LVL3 (GOWN DISPOSABLE) ×4
IRRIGATION STRYKERFLOW (MISCELLANEOUS) IMPLANT
IRRIGATOR STRYKERFLOW (MISCELLANEOUS)
IV CATH ANGIO 12GX3 LT BLUE (NEEDLE) IMPLANT
KIT PINK PAD W/HEAD ARE REST (MISCELLANEOUS) ×3
KIT PINK PAD W/HEAD ARM REST (MISCELLANEOUS) ×2 IMPLANT
LABEL OR SOLS (LABEL) ×3 IMPLANT
MANIFOLD NEPTUNE II (INSTRUMENTS) ×3 IMPLANT
NEEDLE HYPO 22GX1.5 SAFETY (NEEDLE) ×3 IMPLANT
NS IRRIG 500ML POUR BTL (IV SOLUTION) ×3 IMPLANT
OBTURATOR OPTICAL STANDARD 8MM (TROCAR) ×1
OBTURATOR OPTICAL STND 8 DVNC (TROCAR) ×2
OBTURATOR OPTICALSTD 8 DVNC (TROCAR) ×2 IMPLANT
PACK LAP CHOLECYSTECTOMY (MISCELLANEOUS) ×3 IMPLANT
PENCIL ELECTRO HAND CTR (MISCELLANEOUS) ×3 IMPLANT
SEAL CANN UNIV 5-8 DVNC XI (MISCELLANEOUS) ×6 IMPLANT
SEAL XI 5MM-8MM UNIVERSAL (MISCELLANEOUS) ×3
SET TUBE SMOKE EVAC HIGH FLOW (TUBING) ×3 IMPLANT
SOLUTION ELECTROLUBE (MISCELLANEOUS) ×3 IMPLANT
SPIKE FLUID TRANSFER (MISCELLANEOUS) ×3 IMPLANT
SPONGE T-LAP 18X18 ~~LOC~~+RFID (SPONGE) ×3 IMPLANT
SPONGE T-LAP 4X18 ~~LOC~~+RFID (SPONGE) IMPLANT
STAPLER CANNULA SEAL DVNC XI (STAPLE) ×2 IMPLANT
STAPLER CANNULA SEAL XI (STAPLE) ×1
STOPCOCK 3 WAY MALE LL (IV SETS)
STOPCOCK 3WAY MALE LL (IV SETS) IMPLANT
SUT ETHIBOND 0 MO6 C/R (SUTURE) ×1 IMPLANT
SUT MNCRL AB 4-0 PS2 18 (SUTURE) ×3 IMPLANT
SUT VICRYL 0 AB UR-6 (SUTURE) ×6 IMPLANT
SYR 20ML LL LF (SYRINGE) ×3 IMPLANT
SYR 30ML LL (SYRINGE) ×2 IMPLANT
SYS BAG RETRIEVAL 10MM (BASKET) ×2
SYSTEM BAG RETRIEVAL 10MM (BASKET) ×2 IMPLANT
WATER STERILE IRR 3000ML UROMA (IV SOLUTION) IMPLANT
WATER STERILE IRR 500ML POUR (IV SOLUTION) ×2 IMPLANT

## 2022-04-30 NOTE — Progress Notes (Signed)
PT Cancellation Note  Patient Details Name: Debra Gutierrez MRN: [id number] DOB: 1930/01/31   Cancelled Treatment:    Reason Eval/Treat Not Completed: Other (comment) Due to patient being scheduled for surgery this morning, PT is signing off. Post surgery patient will need new PT orders per hospital guidelines.Thank you!   Iva Boop, PT  04/30/22. 8:05 AM

## 2022-04-30 NOTE — Op Note (Signed)
Robotic assisted laparoscopic Cholecystectomy repair of 3 cm incarcerated incisional hernia  Pre-operative Diagnosis: Acute on chronic cholecystitis  Post-operative Diagnosis: same  Procedure:  Robotic assisted laparoscopic Cholecystectomy, ventral hernia repair  Surgeon: Sterling Big, MD FACS  Anesthesia: Gen. with endotracheal tube  Findings: Chronic Cholecystitis  Dilated cystic, hepatic duct and cbd 3 cms incisional hernia chronically incarcerated fat  Estimated Blood Loss: 10cc       Specimens: Gallbladder , hernia sac           Complications: none   Procedure Details  The patient was seen again in the Holding Room. The benefits, complications, treatment options, and expected outcomes were discussed with the patient. The risks of bleeding, infection, recurrence of symptoms, failure to resolve symptoms, bile duct damage, bile duct leak, retained common bile duct stone, bowel injury, any of which could require further surgery and/or ERCP, stent, or papillotomy were reviewed with the patient. The likelihood of improving the patient's symptoms with return to their baseline status is good.  The patient and/or family concurred with the proposed plan, giving informed consent.  The patient was taken to Operating Room, identified  and the procedure verified as Laparoscopic Cholecystectomy.  A Time Out was held and the above information confirmed.  Prior to the induction of general anesthesia, antibiotic prophylaxis was administered. VTE prophylaxis was in place. General endotracheal anesthesia was then administered and tolerated well. After the induction, the abdomen was prepped with Chloraprep and draped in the sterile fashion. The patient was positioned in the supine position.  Incision was made over the ventral defect, the sac was identified and dissected from the fascia,fat was chronically incarcerated. I resected the fat and sac .The abdominal cavity and a Hasson trochar was placed  after two vicryl stitches were anchored to the fascia. Pneumoperitoneum was then created with CO2 and tolerated well without any adverse changes in the patient's vital signs.  Three 8-mm ports were placed under direct vision. All skin incisions  were infiltrated with a local anesthetic agent before making the incision and placing the trocars.   The patient was positioned  in reverse Trendelenburg, robot was brought to the surgical field and docked in the standard fashion.  We made sure all the instrumentation was kept indirect view at all times and that there were no collision between the arms. I scrubbed out and went to the console.  The gallbladder was identified, the fundus grasped and retracted cephalad. Adhesions were lysed bluntly. The infundibulum was grasped and retracted laterally, exposing the peritoneum overlying the triangle of Calot. This was then divided and exposed in a blunt fashion. An extended critical view of the cystic duct and cystic artery was obtained.  The cystic duct was clearly identified and bluntly dissected.   The hepatic, common bile and cystic ducts were chronically dilated. Artery and duct were double clipped and divided. Using ICG cholangiography we visualize the cystic duct and CBD, no evidence of bile injuries observed. The gallbladder was taken from the gallbladder fossa in a retrograde fashion with the electrocautery.  Hemostasis was achieved with the electrocautery. nspection of the right upper quadrant was performed. No bleeding, bile duct injury or leak, or bowel injury was noted. Robotic instruments and robotic arms were undocked in the standard fashion.  I scrubbed back in.  The gallbladder was removed and placed in an Endocatch bag.   Pneumoperitoneum was released.  The hernia defect measured 3 cms, it was closed with interrupted 0 ethibond and I decided against  mesh due to the possibility of infection in the setting of cholecystitis. Marland Kitchen 4-0 subcuticular  Monocryl was used to close the skin. Dermabond was  applied.  The patient was then extubated and brought to the recovery room in stable condition. Sponge, lap, and needle counts were correct at closure and at the conclusion of the case.               Sterling Big, MD, FACS

## 2022-04-30 NOTE — Evaluation (Signed)
Occupational Therapy Evaluation Patient Details Name: Debra Gutierrez MRN: [id number] DOB: 06/17/1930 Today's Date: 04/30/2022   History of Present Illness Pt is a 88 y/o F admitted on 04/25/22 after being referred by her PCP to the ED for evaluation after having an abnormal abdominal US. Pt is being treated for choledocholithiasis. Pt underwent ERCP on 04/25/22. PMH: HTN, arthritis, irregular heartbeat. Now s/p laparoscopic cholecystectomy on 04/30/22.   Clinical Impression   Pt was seen for OT evaluation this date. Prior to hospital admission, pt was independent with mobility and mode indep with ADL/IADL Tasks. Pt lives alone and reports having adult children who can assist PRN. Currently pt demonstrates impairments as described below (See OT problem list) which functionally limit her ability to perform  ADL/self-care tasks. Pt currently requires SBA-CGA for ADL mobility without AD (noted to have 1 slight LOB while turning her head to the side while walking, able to self correct). She donned slip on shoes while seated EOB and completed all aspects of toileting with supervision for safety. Pt ambulated ~80' without AD but noted to have impaired balance. Pt would benefit from skilled OT services to address noted impairments and functional limitations (see below for any additional details) in order to maximize safety and independence while minimizing falls risk and caregiver burden. Do not anticipate additional skilled OT needs following hospitalization.    Recommendations for follow up therapy are one component of a multi-disciplinary discharge planning process, led by the attending physician.  Recommendations may be updated based on patient status, additional functional criteria and insurance authorization.   Follow Up Recommendations  No OT follow up    Assistance Recommended at Discharge Intermittent Supervision/Assistance  Patient can return home with the following Assistance with  cooking/housework;Assist for transportation;Help with stairs or ramp for entrance;A little help with walking and/or transfers    Functional Status Assessment  Patient has had a recent decline in their functional status and demonstrates the ability to make significant improvements in function in a reasonable and predictable amount of time.  Equipment Recommendations  None recommended by OT    Recommendations for Other Services       Precautions / Restrictions Precautions Precautions: Fall Restrictions Weight Bearing Restrictions: No      Mobility Bed Mobility Overal bed mobility: Modified Independent                  Transfers Overall transfer level: Modified independent Equipment used: None                      Balance Overall balance assessment: Needs assistance Sitting-balance support: Feet supported Sitting balance-Leahy Scale: Normal     Standing balance support: No upper extremity supported, During functional activity Standing balance-Leahy Scale: Fair Standing balance comment: slight sway, when attempting to turn her head while walking does have slight LOB and able to self correct.                           ADL either performed or assessed with clinical judgement   ADL                                         General ADL Comments: Pt completed LB dressing, toileting, and standing grooming tasks with supervision for safety, ambulated approx 80' without AD and with SBA to CGA  Vision         Perception     Praxis      Pertinent Vitals/Pain Pain Assessment Pain Assessment: 0-10 Pain Location: reports a little discomfort under R breast/ribs area, like a pulled muscle or "needing to be stretched out", does not rate, just reports it being mild Pain Descriptors / Indicators: Grimacing, Cramping Pain Intervention(s): Limited activity within patient's tolerance, Monitored during session, Repositioned, Ice applied      Hand Dominance     Extremity/Trunk Assessment Upper Extremity Assessment Upper Extremity Assessment: Overall WFL for tasks assessed   Lower Extremity Assessment Lower Extremity Assessment: Overall WFL for tasks assessed       Communication Communication Communication: HOH   Cognition Arousal/Alertness: Awake/alert Behavior During Therapy: WFL for tasks assessed/performed Overall Cognitive Status: Within Functional Limits for tasks assessed                                       General Comments       Exercises     Shoulder Instructions      Home Living Family/patient expects to be discharged to:: Private residence Living Arrangements: Alone Available Help at Discharge: Family;Available PRN/intermittently Type of Home: House Home Access: Stairs to enter Entrance Stairs-Number of Steps: 1   Home Layout: One level               Home Equipment: Agricultural consultant (2 wheels);Cane - single point   Additional Comments: Plans to stay with her daughter for a couple days after discharge.      Prior Functioning/Environment Prior Level of Function : Independent/Modified Independent;Driving             Mobility Comments: Pt endorses 2 falls in past 2 years, reports most falls are outside. ADLs Comments: Cooking, cleaning, bathing, dressing without assistance.        OT Problem List: Impaired balance (sitting and/or standing);Decreased knowledge of use of DME or AE      OT Treatment/Interventions: Self-care/ADL training;Therapeutic exercise;Therapeutic activities;DME and/or AE instruction;Patient/family education;Balance training    OT Goals(Current goals can be found in the care plan section) Acute Rehab OT Goals Patient Stated Goal: go home OT Goal Formulation: With patient Time For Goal Achievement: 05/14/22 Potential to Achieve Goals: Good ADL Goals Additional ADL Goal #1: Pt will complete all aspects of bathing, primarily from seated  position, with modified independence, 2/2 opportunities. Additional ADL Goal #2: Pt will verbalize plan to implement at least 2 learned falls prevention strategies into daily ADL/IADL routines to maximize safety.  OT Frequency: Min 2X/week    Co-evaluation              AM-PAC OT "6 Clicks" Daily Activity     Outcome Measure Help from another person eating meals?: None Help from another person taking care of personal grooming?: None Help from another person toileting, which includes using toliet, bedpan, or urinal?: A Little Help from another person bathing (including washing, rinsing, drying)?: A Little Help from another person to put on and taking off regular upper body clothing?: None Help from another person to put on and taking off regular lower body clothing?: A Little 6 Click Score: 21   End of Session    Activity Tolerance: Patient tolerated treatment well Patient left: in bed;with call bell/phone within reach;with bed alarm set  OT Visit Diagnosis: Other abnormalities of gait and mobility (R26.89);History of falling (Z91.81)  Time: 3716-9678 OT Time Calculation (min): 16 min Charges:  OT General Charges $OT Visit: 1 Visit OT Evaluation $OT Eval Low Complexity: 1 Low  Arman Filter., MPH, MS, OTR/L ascom [phone] 04/30/22, 4:25 PM

## 2022-04-30 NOTE — Anesthesia Preprocedure Evaluation (Signed)
Anesthesia Evaluation  Patient identified by MRN, date of birth, ID band Patient awake    Reviewed: Allergy & Precautions, H&P , NPO status , Patient's Chart, lab work & pertinent test results, reviewed documented beta blocker date and time   Airway Mallampati: III  TM Distance: >3 FB Neck ROM: full    Dental  (+) Teeth Intact   Pulmonary neg pulmonary ROS,    Pulmonary exam normal        Cardiovascular Exercise Tolerance: Good hypertension, On Medications negative cardio ROS Normal cardiovascular exam Rhythm:regular Rate:Normal     Neuro/Psych  Neuromuscular disease negative psych ROS   GI/Hepatic negative GI ROS, Neg liver ROS,   Endo/Other  negative endocrine ROS  Renal/GU negative Renal ROS  negative genitourinary   Musculoskeletal   Abdominal   Peds  Hematology negative hematology ROS (+)   Anesthesia Other Findings Past Medical History: No date: Arthritis No date: Hypertension No date: Irregular heart beat Past Surgical History: No date: ABDOMINAL HYSTERECTOMY No date: BREAST LUMPECTOMY 09/03/2021: COLONOSCOPY; N/A     Comment:  Procedure: COLONOSCOPY;  Surgeon: Jaynie Collins,              DO;  Location: [facility] ENDOSCOPY;  Service:               Gastroenterology;  Laterality: N/A; 04/25/2022: ENDOSCOPIC RETROGRADE CHOLANGIOPANCREATOGRAPHY (ERCP) WITH  PROPOFOL; N/A     Comment:  Procedure: ENDOSCOPIC RETROGRADE               CHOLANGIOPANCREATOGRAPHY (ERCP) WITH PROPOFOL;  Surgeon:               Midge Minium, MD;  Location: [facility] ENDOSCOPY;  Service:               Endoscopy;  Laterality: N/A; No date: KNEE SURGERY No date: thumb surgery No date: WRIST SURGERY BMI    Body Mass Index: 23.03 kg/m     Reproductive/Obstetrics negative OB ROS                             Anesthesia Physical Anesthesia Plan  ASA: 2  Anesthesia Plan: General ETT   Post-op Pain  Management:    Induction:   PONV Risk Score and Plan:   Airway Management Planned:   Additional Equipment:   Intra-op Plan:   Post-operative Plan:   Informed Consent: I have reviewed the patients History and Physical, chart, labs and discussed the procedure including the risks, benefits and alternatives for the proposed anesthesia with the patient or authorized representative who has indicated his/her understanding and acceptance.     Dental Advisory Given  Plan Discussed with: CRNA  Anesthesia Plan Comments:         Anesthesia Quick Evaluation

## 2022-04-30 NOTE — Progress Notes (Signed)
PROGRESS NOTE    Debra Gutierrez  WYS:[id number] DOB: 03/29/1930 DOA: 04/25/2022 PCP: Marinda Elk, MD  Outpatient Specialists: neurology    Brief Narrative:  From admission h and p Debra Gutierrez is a 88 y.o. female with medical history significant for hypertension, arthritis and irregular heartbeat who was referred to the ER by her primary care provider for evaluation after she had an abdominal ultrasound which came back abnormal. Patient states that she was in her usual state of health until 1 day prior to her admission when she developed severe right upper quadrant pain that she rated an 8 x 10 in intensity at its worst.  Pain was nonradiating and was associated with nausea but no emesis.  It subsided after a couple of hours.  She called her primary care provider who ordered a gallbladder ultrasound and it showed a dilated common bile duct which is concerning for Choledocholithiasis. Mild gallbladder wall thickening and sludge with negative sonographic Murphy sign, findings are equivocal for acute cholecystitis. Hepatic steatosis. She has not had any further episodes of abdominal pain and denies having any fever or chills. She denies having any cough,  no headache, no blurred vision, no dizziness, no lightheadedness, no chest pain, no shortness of breath, no leg swelling, no palpitations, no diaphoresis, no blurred vision no focal deficit.  Assessment & Plan:   Principal Problem:   Cholelithiasis with choledocholithiasis Active Problems:   Hypertension   Acute cholecystitis   Post-ERCP acute pancreatitis   Prediabetes  # Cholelithiasis with possible choledocholithiasis # Cholecystitis # Post-ERCP pancreatitis Two recent bouts of ruq abdominal pain. Initial exam and u/s show possible cholecystitis. CBD dilated on u/s. ERCP unsuccessful. MRCP with dilated cbd, no stone seen. Suspect passed gallbladder stone. GI has signed off. Gen surg following. Monitored over the  weekend, stable, pain resolved. Today s/p laparoscopic cholecystectomy, tolerated procedure well.  - clear liquid diet, adat - pain control - bowel regimen - cont ceftriaxone/metronidazole, likely d/c tomorrow - PT/OT consults  # HTN Bp wnl today but was persistently elevated early hospital course - cont home metop, lisinopril at reduced dose of 20 - increased amlodipine to 10 - added hctz  # Elevated glucose A1c 6.1%   DVT prophylaxis: lovenox Code Status: full Family Communication: daughter and son updated @ bedside 5/30  Level of care: Med-Surg Status is: Inpatient Remains inpatient appropriate because: has not yet met post-op goals   Consultants:  GI, gen surg  Procedures: ERCP 5/25, robotic-assisted laparoscopic cholecystectomy w/ ventral hernia repair 5/30  Antimicrobials:  Ceftriaxone/metronidazole 5/26>    Subjective: Tolerated procedure. Pain controlled. Ambulated to bathroom.  Objective: Vitals:   04/30/22 1150 04/30/22 1155 04/30/22 1200 04/30/22 1212  BP:   137/63 119/64  Pulse: 71 73 78 79  Resp: _0 Temp:    (!) 97.4 F (36.3 C)  TempSrc:    Oral  SpO2: 99% 99% 98% 99%  Weight:      Height:        Intake/Output Summary (Last 24 hours) at 04/30/2022 1338 Last data filed at 04/30/2022 1155 Gross per 24 hour  Intake 910 ml  Output 1 ml  Net 909 ml   Filed Weights   04/25/22 1310  Weight: 59 kg    Examination:  General exam: Appears calm and comfortable  Respiratory system: Clear to auscultation. Respiratory effort normal. Cardiovascular system: S1 & S2 heard, RRR. No JVD, murmurs, rubs, gallops or clicks. No pedal edema. Gastrointestinal system:  Abdomen is nondistended, diffuse mild tenderness, surgical sites c/d/i Central nervous system: Alert and oriented. No focal neurological deficits. Extremities: Symmetric 5 x 5 power. Skin: No rashes, lesions or ulcers Psychiatry: Judgement and insight appear normal. Mood & affect  appropriate.     Data Reviewed: I have personally reviewed following labs and imaging studies  CBC: Recent Labs  Lab 04/25/22 1311 04/26/22 0544 04/27/22 0500  WBC 4.7 10.1 4.2  HGB 13.9 12.8 12.2  HCT 42.3 38.7 37.6  MCV 97.5 95.6 97.7  PLT 223 188 945   Basic Metabolic Panel: Recent Labs  Lab 04/25/22 1311 04/26/22 0544 04/27/22 0500 04/29/22 0307 04/30/22 0501  NA 140 142 138 139 138  K 3.7 3.5 3.9 3.8 3.8  CL 105 111 107 107 104  CO2 _0 GLUCOSE 124* 131* 116* 120* 143*  BUN _1 7* 9  CREATININE 1.00 0.81 0.56 0.66 0.87  CALCIUM 9.1 8.4* 8.5* 8.9 8.9   GFR: Estimated Creatinine Clearance: 35.6 mL/min (by C-G formula based on SCr of 0.87 mg/dL). Liver Function Tests: Recent Labs  Lab 04/25/22 1311 04/26/22 0544 04/27/22 0500  AST 290* 144* 59*  ALT 307* 223* 147*  ALKPHOS 124 104 102  BILITOT 1.3* 1.0 0.5  PROT 7.5 6.1* 6.0*  ALBUMIN 4.4 3.5 3.2*   Recent Labs  Lab 04/25/22 1311 04/26/22 0544 04/27/22 0500  LIPASE 27 525* 153*   No results for input(s): AMMONIA in the last 168 hours. Coagulation Profile: No results for input(s): INR, PROTIME in the last 168 hours. Cardiac Enzymes: No results for input(s): CKTOTAL, CKMB, CKMBINDEX, TROPONINI in the last 168 hours. BNP (last 3 results) No results for input(s): PROBNP in the last 8760 hours. HbA1C: No results for input(s): HGBA1C in the last 72 hours.  CBG: No results for input(s): GLUCAP in the last 168 hours. Lipid Profile: No results for input(s): CHOL, HDL, LDLCALC, TRIG, CHOLHDL, LDLDIRECT in the last 72 hours. Thyroid Function Tests: No results for input(s): TSH, T4TOTAL, FREET4, T3FREE, THYROIDAB in the last 72 hours. Anemia Panel: No results for input(s): VITAMINB12, FOLATE, FERRITIN, TIBC, IRON, RETICCTPCT in the last 72 hours. Urine analysis:    Component Value Date/Time   COLORURINE YELLOW (A) 04/25/2022 1535   APPEARANCEUR CLEAR (A) 04/25/2022 1535   LABSPEC  1.009 04/25/2022 1535   PHURINE 7.0 04/25/2022 1535   GLUCOSEU NEGATIVE 04/25/2022 1535   HGBUR NEGATIVE 04/25/2022 1535   BILIRUBINUR NEGATIVE 04/25/2022 1535   KETONESUR NEGATIVE 04/25/2022 1535   PROTEINUR NEGATIVE 04/25/2022 1535   NITRITE NEGATIVE 04/25/2022 1535   LEUKOCYTESUR NEGATIVE 04/25/2022 1535   Sepsis Labs: _2 (procalcitonin:4,lacticidven:4)  )No results found for this or any previous visit (from the past 240 hour(s)).       Radiology Studies: No results found.      Scheduled Meds:  acetaminophen  1,000 mg Oral Q6H   amLODipine  10 mg Oral Daily   cholecalciferol  1,000 Units Oral Daily   hydrochlorothiazide  25 mg Oral Daily   ketorolac       lisinopril  20 mg Oral Daily   metoprolol succinate  25 mg Oral Daily   traMADol  50 mg Oral Q6H   Continuous Infusions:  cefTRIAXone (ROCEPHIN)  IV 2 g (04/29/22 1135)   metronidazole 500 mg (04/29/22 2202)     LOS: 5 days     Desma Maxim, MD [facility]   If 7PM-7AM, please contact night-coverage [email or web address] Password [facility] 04/30/2022,  1:38 PM

## 2022-04-30 NOTE — Anesthesia Procedure Notes (Signed)
Procedure Name: Intubation Date/Time: 04/30/2022 8:45 AM Performed by: Philbert Riser, CRNA Pre-anesthesia Checklist: Patient identified, Patient being monitored, Timeout performed, Emergency Drugs available and Suction available Patient Re-evaluated:Patient Re-evaluated prior to induction Oxygen Delivery Method: Circle system utilized Preoxygenation: Pre-oxygenation with 100% oxygen Induction Type: IV induction Ventilation: Mask ventilation without difficulty Laryngoscope Size: 3 and McGraph Grade View: Grade I Tube type: Oral Tube size: 7.0 mm Number of attempts: 1 Airway Equipment and Method: Stylet Placement Confirmation: ETT inserted through vocal cords under direct vision, positive ETCO2 and breath sounds checked- equal and bilateral Secured at: 21 cm Tube secured with: Tape Dental Injury: Teeth and Oropharynx as per pre-operative assessment

## 2022-04-30 NOTE — Plan of Care (Signed)
Pt AAOx4, no pain. VS are WNL. Plan for surgery this morning. Family notified. Bed is in lowest position, call light within reach. Will continue to monitor.

## 2022-04-30 NOTE — Transfer of Care (Addendum)
Immediate Anesthesia Transfer of Care Note  Patient: VIDIA PEPPIN  Procedure(s) Performed: XI ROBOTIC ASSISTED LAPAROSCOPIC CHOLECYSTECTOMY INDOCYANINE GREEN FLUORESCENCE IMAGING (ICG)  Patient Location: PACU  Anesthesia Type:General  Level of Consciousness: drowsy  Airway & Oxygen Therapy: Patient Spontanous Breathing and Patient connected to face mask oxygen  Post-op Assessment: Report given to RN and Post -op Vital signs reviewed and stable  Post vital signs: Reviewed and stable  Last Vitals:  Vitals Value Taken Time  BP    Temp    Pulse    Resp    SpO2      Last Pain:  Vitals:   04/30/22 0813  TempSrc: Temporal  PainSc: 0-No pain      Patients Stated Pain Goal: 0 ([id number])  Complications: No notable events documented.

## 2022-04-30 NOTE — Progress Notes (Signed)
Preoperative Review   Patient is met in the preoperative holding area. The history is reviewed in the chart and with the patient. I personally reviewed the options and rationale as well as the risks of this procedure that have been previously discussed with the patient.  I discussed the procedure in detail.  The patient was given Neurosurgeon.  We discussed the risks and benefits of a laparoscopic cholecystectomy and possible cholangiogram including, but not limited to bleeding, infection, injury to surrounding structures such as the intestine or liver, bile leak, retained gallstones, need to convert to an open procedure, prolonged diarrhea, blood clots such as  DVT, common bile duct injury, anesthesia risks, and possible need for additional procedures.  The likelihood of improvement in symptoms and return to the patient's normal status is good. We discussed the typical post-operative recovery course.   All questions asked by the patient and/or family were answered to their satisfaction.  Patient agrees to proceed with this procedure at this time.  Caroleen Hamman M.D. FACS

## 2022-05-01 ENCOUNTER — Encounter: Payer: Self-pay | Admitting: Surgery

## 2022-05-01 DIAGNOSIS — K8070 Calculus of gallbladder and bile duct without cholecystitis without obstruction: Secondary | ICD-10-CM | POA: Diagnosis not present

## 2022-05-01 DIAGNOSIS — K859 Acute pancreatitis without necrosis or infection, unspecified: Secondary | ICD-10-CM | POA: Diagnosis not present

## 2022-05-01 DIAGNOSIS — K9189 Other postprocedural complications and disorders of digestive system: Secondary | ICD-10-CM | POA: Diagnosis not present

## 2022-05-01 DIAGNOSIS — K810 Acute cholecystitis: Secondary | ICD-10-CM | POA: Diagnosis not present

## 2022-05-01 LAB — CBC
HCT: 39.1 % (ref 36.0–46.0)
Hemoglobin: 13 g/dL (ref 12.0–15.0)
MCH: 31.9 pg (ref 26.0–34.0)
MCHC: 33.2 g/dL (ref 30.0–36.0)
MCV: 96.1 fL (ref 80.0–100.0)
Platelets: 237 10*3/uL (ref 150–400)
RBC: 4.07 MIL/uL (ref 3.87–5.11)
RDW: 12.6 % (ref 11.5–15.5)
WBC: 12 10*3/uL — ABNORMAL HIGH (ref 4.0–10.5)
nRBC: 0 % (ref 0.0–0.2)

## 2022-05-01 LAB — BASIC METABOLIC PANEL
Anion gap: 8 (ref 5–15)
BUN: 15 mg/dL (ref 8–23)
CO2: 23 mmol/L (ref 22–32)
Calcium: 8.7 mg/dL — ABNORMAL LOW (ref 8.9–10.3)
Chloride: 104 mmol/L (ref 98–111)
Creatinine, Ser: 0.7 mg/dL (ref 0.44–1.00)
GFR, Estimated: >60 mL/min (ref >60)
Glucose, Bld: 162 mg/dL — ABNORMAL HIGH (ref 70–99)
Potassium: 4.3 mmol/L (ref 3.5–5.1)
Sodium: 135 mmol/L (ref 135–145)

## 2022-05-01 LAB — SURGICAL PATHOLOGY

## 2022-05-01 MED ORDER — METRONIDAZOLE 500 MG PO TABS: 500.0000 mg | ORAL_TABLET | Freq: Three times a day (TID) | ORAL | 0 refills | Status: AC

## 2022-05-01 MED ORDER — CEPHALEXIN 500 MG PO CAPS
500.0000 mg | ORAL_CAPSULE | Freq: Three times a day (TID) | ORAL | 0 refills | Status: AC
Start: 2022-05-01 — End: 2022-05-06

## 2022-05-01 NOTE — Plan of Care (Signed)
  Problem: Clinical Measurements: Goal: Ability to maintain clinical measurements within normal limits will improve Outcome: Progressing   Problem: Elimination: Goal: Will not experience complications related to urinary retention Outcome: Progressing   

## 2022-05-01 NOTE — TOC Transition Note (Signed)
Transition of Care [facility]) - CM/SW Discharge Note   Patient Details  Name: Debra Gutierrez MRN: [id number] Date of Birth: 1930/08/17  Transition of Care [facility]) CM/SW Contact:  Margarito Liner, LCSW Phone Number: 05/01/2022, 12:58 PM   Clinical Narrative:   Patient has orders to discharge home today. PT changed recommendation to no follow up. No DME needs. No further concerns. CSW signing off.  Final next level of care: Home/Self Care Barriers to Discharge: Barriers Resolved   Patient Goals and CMS Choice Patient states their goals for this hospitalization and ongoing recovery are:: agreeable to OPPT CMS Medicare.gov Compare Post Acute Care list provided to:: Patient Choice offered to / list presented to : Patient  Discharge Placement                    Patient and family notified of of transfer: 05/01/22  Discharge Plan and Services                                     Social Determinants of Health (SDOH) Interventions     Readmission Risk Interventions     View : No data to display.

## 2022-05-01 NOTE — Evaluation (Signed)
Physical Therapy Evaluation Patient Details Name: Debra Gutierrez MRN: [id number] DOB: January 28, 1930 Today's Date: 05/01/2022  History of Present Illness  Pt is a 88 y/o F admitted on 04/25/22 after being referred by her PCP to the ED for evaluation after having an abnormal abdominal US. Pt is being treated for choledocholithiasis. Pt underwent ERCP on 04/25/22. PMH: HTN, arthritis, irregular heartbeat. Now s/p laparoscopic cholecystectomy on 04/30/22.   Clinical Impression  Patient received in bed, agrees to PT assessment, reports mild tightness in R UQ. Independent with bed mobility and transfers. Ambulated 250 feet with min guard/supervision, no AD. No LOB. Mild lateral sway at times. She appears to be at baseline level of mobility and will be staying with daughter at discharge. No further skilled PT needs at this time.          Recommendations for follow up therapy are one component of a multi-disciplinary discharge planning process, led by the attending physician.  Recommendations may be updated based on patient status, additional functional criteria and insurance authorization.  Follow Up Recommendations No PT follow up    Assistance Recommended at Discharge PRN  Patient can return home with the following  Assistance with cooking/housework;Help with stairs or ramp for entrance    Equipment Recommendations None recommended by PT  Recommendations for Other Services       Functional Status Assessment Patient has not had a recent decline in their functional status     Precautions / Restrictions Precautions Precautions: Fall Restrictions Weight Bearing Restrictions: No      Mobility  Bed Mobility Overal bed mobility: Modified Independent             General bed mobility comments: supine<>sit with HOB slightly elevated    Transfers Overall transfer level: Modified independent Equipment used: None                    Ambulation/Gait Ambulation/Gait assistance:  Supervision Gait Distance (Feet): 250 Feet Assistive device: None Gait Pattern/deviations: Step-through pattern, Shuffle, Drifts right/left Gait velocity: WFL     General Gait Details: patient generally steady with mobility. No lob, shuffle gait with slippers donned.  Stairs            Wheelchair Mobility    Modified Rankin (Stroke Patients Only)       Balance Overall balance assessment: Modified Independent, Mild deficits observed, not formally tested Sitting-balance support: Feet supported Sitting balance-Leahy Scale: Normal     Standing balance support: No upper extremity supported, During functional activity Standing balance-Leahy Scale: Good                               Pertinent Vitals/Pain Pain Assessment Pain Assessment: Faces Faces Pain Scale: Hurts a little bit Breathing: normal Negative Vocalization: none Facial Expression: smiling or inexpressive Body Language: relaxed Consolability: no need to console PAINAD Score: 0 Pain Location: reports a little discomfort under R breast/ribs area, like a pulled muscle or "needing to be stretched out", does not rate, just reports it being mild Pain Descriptors / Indicators: Tightness Pain Intervention(s): Monitored during session    Home Living Family/patient expects to be discharged to:: Private residence Living Arrangements: Alone Available Help at Discharge: Family;Available PRN/intermittently Type of Home: House Home Access: Stairs to enter   Entrance Stairs-Number of Steps: 1   Home Layout: One level   Additional Comments: Plans to stay with her daughter for a couple days after discharge.  Prior Function Prior Level of Function : Independent/Modified Independent;Driving             Mobility Comments: Pt endorses 2 falls in past 2 years, reports most falls are outside. ADLs Comments: Cooking, cleaning, bathing, dressing without assistance.     Hand Dominance         Extremity/Trunk Assessment   Upper Extremity Assessment Upper Extremity Assessment: Defer to OT evaluation    Lower Extremity Assessment Lower Extremity Assessment: Overall WFL for tasks assessed    Cervical / Trunk Assessment Cervical / Trunk Assessment: Normal  Communication   Communication: HOH  Cognition Arousal/Alertness: Awake/alert Behavior During Therapy: WFL for tasks assessed/performed Overall Cognitive Status: Within Functional Limits for tasks assessed                                          General Comments      Exercises     Assessment/Plan    PT Assessment Patient does not need any further PT services  PT Problem List Decreased balance       PT Treatment Interventions DME instruction;Therapeutic exercise;Gait training;Balance training;Stair training;Functional mobility training;Therapeutic activities;Patient/family education    PT Goals (Current goals can be found in the Care Plan section)  Acute Rehab PT Goals Patient Stated Goal: go home PT Goal Formulation: With patient Time For Goal Achievement: 05/10/22 Potential to Achieve Goals: Good    Frequency Min 2X/week     Co-evaluation               AM-PAC PT "6 Clicks" Mobility  Outcome Measure Help needed turning from your back to your side while in a flat bed without using bedrails?: None Help needed moving from lying on your back to sitting on the side of a flat bed without using bedrails?: None Help needed moving to and from a bed to a chair (including a wheelchair)?: None Help needed standing up from a chair using your arms (e.g., wheelchair or bedside chair)?: None Help needed to walk in hospital room?: A Little Help needed climbing 3-5 steps with a railing? : A Little 6 Click Score: 22    End of Session Equipment Utilized During Treatment: Gait belt Activity Tolerance: Patient tolerated treatment well Patient left: in bed;with call bell/phone within reach Nurse  Communication: Mobility status      Time: 2355-7322 PT Time Calculation (min) (ACUTE ONLY): 18 min   Charges:   PT Evaluation $PT Eval Low Complexity: 1 Low PT Treatments $Gait Training: 8-22 mins        Kohan Azizi, PT, GCS 05/01/22,10:22 AM

## 2022-05-01 NOTE — Progress Notes (Signed)
[facility] SURGICAL PROGRESS NOTE  Hospital Day(s): 6.   Post op day(s): 1 Day Post-Op.   Interval History:  Patient seen and examined No acute events or new complaints overnight.  Patient reports she is doing well; almost no pain at all No fever, chills, nausea, emesis  Leukocytosis to 12.0K this morning; likely reactive from OR Renal function normal; sCr - 0.70; UO - unmeasured No electrolyte derangements She is on Rocephin/Flagyl On soft diet; tolerating well   Vital signs in last 24 hours: [min-max] current  Temp:  [97.2 F (36.2 C)-97.9 F (36.6 C)] 97.9 F (36.6 C) (05/31 0401) Pulse Rate:  [65-99] 70 (05/31 0401) Resp:  [11-33] 16 (05/31 0401) BP: (112-151)/(54-99) 125/58 (05/31 0401) SpO2:  [90 %-100 %] 94 % (05/31 0401)     Height: 5\' 3"  (160 cm) Weight: 59 kg BMI (Calculated): 23.03   Intake/Output last 2 shifts:  05/30 0701 - 05/31 0700 In: 1923.4 [P.O.:420; I.V.:311.9; IV Piggyback:1191.5] Out: 1 [Blood:1]   Physical Exam:  Constitutional: alert, cooperative and no distress  Respiratory: breathing non-labored at rest  Cardiovascular: regular rate and sinus rhythm  Gastrointestinal: soft, non-tender, and non-distended, no rebound/guarding Integumentary: Laparoscopic incisions are CDI with dermabond, some ecchymosis, no erythema or drainage   Labs:     Latest Ref Rng & Units 05/01/2022    5:59 AM 04/27/2022    5:00 AM 04/26/2022    5:44 AM  CBC  WBC 4.0 - 10.5 K/uL 12.0   4.2   10.1    Hemoglobin 12.0 - 15.0 g/dL 04/28/2022   18.5   63.1    Hematocrit 36.0 - 46.0 % 39.1   37.6   38.7    Platelets 150 - 400 K/uL 237   171   188        Latest Ref Rng & Units 05/01/2022    5:59 AM 04/30/2022    5:01 AM 04/29/2022    3:07 AM  CMP  Glucose 70 - 99 mg/dL 05/01/2022   026   378    BUN 8 - 23 mg/dL 15   9   7     Creatinine 0.44 - 1.00 mg/dL 588     5.02    Sodium 135 - 145 mmol/L 135   138   139    Potassium 3.5 - 5.1 mmol/L 4.3   3.8   3.8     Chloride 98 - 111 mmol/L 104   104   107    CO2 22 - 32 mmol/L 23   28   26     Calcium 8.9 - 10.3 mg/dL 8.7   8.9   8.9       Imaging studies: No new pertinent imaging studies   Assessment/Plan:  88 y.o. female 1 Day Post-Op s/p robotic assisted laparoscopic cholecystectomy and ventral hernia repair for acute on chronic cholecystitis and chronically incarcerated ventral hernia containing fat.   - Okay to continue soft/regular diet as tolerated; Updated on dietary recommendation after cholecystectomy    - Continue IV Abx (Rocephin/Flagyl) in house; Can do 7 days PO for home - Monitor abdominal examination; on-going bowel function - Pain control prn; antiemetics prn   - Mobilization as tolerated   - Further management per primary service; we will follow  - Discharge Planning; Okay for discharge from surgery standpoint; DC instructions and follow up updated on chart.    All of the above findings and recommendations were discussed with the patient, and the medical  team, and all of patient's questions were answered to her expressed satisfaction.  -- Lynden Oxford, PA-C  [facility] 05/01/2022, 7:32 AM M-F: 7am - 4pm

## 2022-05-01 NOTE — Anesthesia Postprocedure Evaluation (Signed)
Anesthesia Post Note  Patient: Debra Gutierrez  Procedure(s) Performed: XI ROBOTIC ASSISTED LAPAROSCOPIC CHOLECYSTECTOMY INDOCYANINE GREEN FLUORESCENCE IMAGING (ICG) HERNIA REPAIR UMBILICAL ADULT  Patient location during evaluation: PACU Anesthesia Type: General Level of consciousness: awake and alert Pain management: pain level controlled Vital Signs Assessment: post-procedure vital signs reviewed and stable Respiratory status: spontaneous breathing, nonlabored ventilation, respiratory function stable and patient connected to nasal cannula oxygen Cardiovascular status: blood pressure returned to baseline and stable Postop Assessment: no apparent nausea or vomiting Anesthetic complications: no   No notable events documented.   Last Vitals:  Vitals:   05/01/22 0401 05/01/22 0757  BP: (!) 125/58 136/74  Pulse: 70 72  Resp: 16 18  Temp: 36.6 C (!) 36.3 C  SpO2: 94% 98%    Last Pain:  Vitals:   05/01/22 0757  TempSrc: Oral  PainSc:                  Yevette Edwards

## 2022-05-01 NOTE — Progress Notes (Signed)
Discharge instructions reviewed with patient including followup visits and new medications.  Understanding was verbalized and all questions were answered.  IV removed without complication; patient tolerated well.  Patient discharged home via wheelchair in stable condition escorted by volunteer staff.  

## 2022-05-01 NOTE — Plan of Care (Signed)
  Problem: Clinical Measurements: Goal: Ability to maintain clinical measurements within normal limits will improve Outcome: Progressing   Problem: Elimination: Goal: Will not experience complications related to bowel motility Outcome: Progressing   

## 2022-05-01 NOTE — Discharge Summary (Signed)
Physician Discharge Summary   Patient: Debra Gutierrez MRN: [id number] DOB: 1930-08-15  Admit date:     04/25/2022  Discharge date: 05/01/22  Discharge Physician: Sharen Hones   PCP: Marinda Elk, MD   Recommendations at discharge:   Follow-up with PCP in 1 week. Follow-up with general surgery as scheduled  Discharge Diagnoses: Principal Problem:   Cholelithiasis with choledocholithiasis Active Problems:   Hypertension   Acute cholecystitis   Post-ERCP acute pancreatitis   Prediabetes  Resolved Problems:   * No resolved hospital problems. *  Hospital Course: Debra Gutierrez is a 88 y.o. female with medical history significant for hypertension, arthritis and irregular heartbeat who was referred to the ER by her primary care provider for evaluation after she had an abdominal ultrasound which came back abnormal. Patient states that she was in her usual state of health until 1 day prior to her admission when she developed severe right upper quadrant pain that she rated an 8 x 10 in intensity at its worst.  Pain was nonradiating and was associated with nausea but no emesis.  It subsided after a couple of hours.  She called her primary care provider who ordered a gallbladder ultrasound and it showed a dilated common bile duct which is concerning for Choledocholithiasis. Mild gallbladder wall thickening and sludge with negative sonographic Murphy sign, findings are equivocal for acute cholecystitis. Hepatic steatosis.  Assessment and Plan: Cholelithiasis with cholecystitis. Post ERCP pancreatitis. Two recent bouts of ruq abdominal pain. Initial exam and u/s show possible cholecystitis. CBD dilated on u/s. ERCP unsuccessful. MRCP with dilated cbd, no stone seen. Suspect passed gallbladder stone.  Patient had a laparoscopic cholecystectomy on 5/30.  Since surgery, patient has been doing well, she has some chronic diarrhea.  But no nausea vomiting.  She is medically stable to  be discharged.  We will continue oral antibiotics with Keflex and Flagyl for additional 5 days to complete 10-day course.  Essential hypertension. Resume home treatment.  Hyperglycemia secondary to stress.  A1c 6.1.      Consultants: GI, General surgery Procedures performed: ERCP and cholecystectomy Disposition: Home Diet recommendation:  Discharge Diet Orders (From admission, onward)     Start     Ordered   05/01/22 0000  Diet - low sodium heart healthy        05/01/22 1236           Cardiac diet DISCHARGE MEDICATION: Allergies as of 05/01/2022       Reactions   Daypro [oxaprozin] Tinitus   Penicillins Hives        Medication List     STOP taking these medications    Cod Liver Oil 5000-500 UNIT/5ML Oil       TAKE these medications    amLODipine 2.5 MG tablet Commonly known as: NORVASC Take 2.5 mg by mouth daily.   Apple Cider Vinegar 300 MG Tabs Take 900 mg by mouth.   cephALEXin 500 MG capsule Commonly known as: KEFLEX Take 1 capsule (500 mg total) by mouth 3 (three) times daily for 5 days.   Cholecalciferol 25 MCG (1000 UT) tablet Take 1,000 Units by mouth daily.   Co Q 10 10 MG Caps Take 10 mg by mouth daily.   DOCOSAHEXAENOIC ACID PO Take 1 tablet by mouth daily.   lisinopril 20 MG tablet Commonly known as: ZESTRIL Take 20 mg by mouth daily.   metoprolol succinate 25 MG 24 hr tablet Commonly known as: TOPROL-XL Take 25 mg by mouth  daily.   metroNIDAZOLE 500 MG tablet Commonly known as: Flagyl Take 1 tablet (500 mg total) by mouth 3 (three) times daily for 5 days.   NON FORMULARY Take 1 Dose by mouth daily.   Red Yeast Rice 600 MG Caps Take 600 mg by mouth.        Follow-up Information     Tylene Fantasia, PA-C. Schedule an appointment as soon as possible for a visit in 3 week(s).   Specialty: Physician Assistant Why: s/p lap cholecystectomy Contact information: [address] [place]  [address] [phone]         Marinda Elk, MD Follow up in 1 week(s).   Specialty: Physician Assistant Contact information: [place] [address] [phone]                Discharge Exam: Danley Danker Weights   04/25/22 1310  Weight: 59 kg   General exam: Appears calm and comfortable  Respiratory system: Clear to auscultation. Respiratory effort normal. Cardiovascular system: S1 & S2 heard, RRR. No JVD, murmurs, rubs, gallops or clicks. No pedal edema. Gastrointestinal system: Abdomen is nondistended, soft and nontender. No organomegaly or masses felt. Normal bowel sounds heard. Central nervous system: Alert and oriented. No focal neurological deficits. Extremities: Symmetric 5 x 5 power. Skin: No rashes, lesions or ulcers Psychiatry: Judgement and insight appear normal. Mood & affect appropriate. '  Condition at discharge: good  The results of significant diagnostics from this hospitalization (including imaging, microbiology, ancillary and laboratory) are listed below for reference.   Imaging Studies: MR 3D Recon At Scanner  Result Date: 04/25/2022 CLINICAL DATA:  Right upper quadrant abdominal pain. Possible acute cholecystitis on ultrasound. Dilated common duct, raising the possibility of choledocholithiasis. EXAM: MRI ABDOMEN WITHOUT AND WITH CONTRAST (INCLUDING MRCP) TECHNIQUE: Multiplanar multisequence MR imaging of the abdomen was performed both before and after the administration of intravenous contrast. Heavily T2-weighted images of the biliary and pancreatic ducts were obtained, and three-dimensional MRCP images were rendered by post processing. CONTRAST:  22mL GADAVIST GADOBUTROL 1 MMOL/ML IV SOLN COMPARISON:  Right upper quadrant ultrasound dated 04/25/2022. CTA abdomen/pelvis dated 03/16/2022. FINDINGS: Motion degraded images. Lower chest: Lung bases are clear. Hepatobiliary: Scattered hepatic cysts, measuring up to  3.0 cm in segment 3 (series 21/image 45). No suspicious/enhancing hepatic lesions. Layering tiny gallstones (series 4/image 21), with mild gallbladder wall thickening (series 4/image 26), but no convincing findings of acute cholecystitis. Very mild central intrahepatic ductal dilatation, similar to the prior. Dilated common duct, measuring 12 mm (series 3/image 22), previously 10 mm on CT. No choledocholithiasis is seen. Pancreas:  Within normal limits. Spleen:  Within normal limits. Adrenals/Urinary Tract:  Adrenal glands are within normal limits. Kidneys are within normal limits.  No hydronephrosis. Stomach/Bowel: Stomach and visualized bowel are grossly unremarkable. Vascular/Lymphatic:  No evidence of abdominal aortic aneurysm. No suspicious abdominal lymphadenopathy. Other:  No abdominal ascites. Musculoskeletal: No focal osseous lesions. IMPRESSION: Motion degraded images. Cholelithiasis with mild gallbladder wall thickening, but without convincing findings of acute cholecystitis. Dilated common duct, measuring 12 mm, minimally progressive from prior CT. No choledocholithiasis is seen. Electronically Signed   By: Julian Hy M.D.   On: 04/25/2022 23:16   DG C-Arm 1-60 Min-No Report  Result Date: 04/25/2022 Fluoroscopy was utilized by the requesting physician.  No radiographic interpretation.   MR ABDOMEN MRCP W WO CONTAST  Result Date: 04/25/2022 CLINICAL DATA:  Right upper quadrant abdominal pain. Possible acute cholecystitis  on ultrasound. Dilated common duct, raising the possibility of choledocholithiasis. EXAM: MRI ABDOMEN WITHOUT AND WITH CONTRAST (INCLUDING MRCP) TECHNIQUE: Multiplanar multisequence MR imaging of the abdomen was performed both before and after the administration of intravenous contrast. Heavily T2-weighted images of the biliary and pancreatic ducts were obtained, and three-dimensional MRCP images were rendered by post processing. CONTRAST:  5mL GADAVIST GADOBUTROL 1 MMOL/ML  IV SOLN COMPARISON:  Right upper quadrant ultrasound dated 04/25/2022. CTA abdomen/pelvis dated 03/16/2022. FINDINGS: Motion degraded images. Lower chest: Lung bases are clear. Hepatobiliary: Scattered hepatic cysts, measuring up to 3.0 cm in segment 3 (series 21/image 45). No suspicious/enhancing hepatic lesions. Layering tiny gallstones (series 4/image 21), with mild gallbladder wall thickening (series 4/image 26), but no convincing findings of acute cholecystitis. Very mild central intrahepatic ductal dilatation, similar to the prior. Dilated common duct, measuring 12 mm (series 3/image 22), previously 10 mm on CT. No choledocholithiasis is seen. Pancreas:  Within normal limits. Spleen:  Within normal limits. Adrenals/Urinary Tract:  Adrenal glands are within normal limits. Kidneys are within normal limits.  No hydronephrosis. Stomach/Bowel: Stomach and visualized bowel are grossly unremarkable. Vascular/Lymphatic:  No evidence of abdominal aortic aneurysm. No suspicious abdominal lymphadenopathy. Other:  No abdominal ascites. Musculoskeletal: No focal osseous lesions. IMPRESSION: Motion degraded images. Cholelithiasis with mild gallbladder wall thickening, but without convincing findings of acute cholecystitis. Dilated common duct, measuring 12 mm, minimally progressive from prior CT. No choledocholithiasis is seen. Electronically Signed   By: Julian Hy M.D.   On: 04/25/2022 23:16   US Abdomen Limited RUQ (LIVER/GB)  Result Date: 04/25/2022 CLINICAL DATA:  Abdominal pain EXAM: ULTRASOUND ABDOMEN LIMITED RIGHT UPPER QUADRANT COMPARISON:  Abdominal ultrasound dated September 24, 2019 FINDINGS: Gallbladder: No gallstones. Mild gallbladder wall thickening. Sludge is seen in the gallbladder. No sonographic Murphy sign noted by sonographer. Common bile duct: Diameter: 10.4 Liver: Simple appearing cyst of the left lobe of the liver measuring up to 4.3 cm and 1.7 cm. Heterogeneous echotexture with increased  parenchymal echogenicity. Portal vein is patent on color Doppler imaging with normal direction of blood flow towards the liver. Other: None. IMPRESSION: 1. Dilated common bile duct which is concerning for choledocholithiasis. Recommend MRCP for further evaluation. 2. Mild gallbladder wall thickening and sludge with negative sonographic Murphy sign, findings are equivocal for acute cholecystitis. 3. Hepatic steatosis. Electronically Signed   By: Yetta Glassman M.D.   On: 04/25/2022 09:17    Microbiology: No results found for this or any previous visit.  Labs: CBC: Recent Labs  Lab 04/25/22 1311 04/26/22 0544 04/27/22 0500 05/01/22 0559  WBC 4.7 10.1 4.2 12.0*  HGB 13.9 12.8 12.2 13.0  HCT 42.3 38.7 37.6 39.1  MCV 97.5 95.6 97.7 96.1  PLT 223 188 171 [id number]   Basic Metabolic Panel: Recent Labs  Lab 04/26/22 0544 04/27/22 0500 04/29/22 0307 04/30/22 0501 05/01/22 0559  NA 142 138 139 138 135  K 3.5 3.9 3.8 3.8 4.3  CL 111 107 107 104 104  CO2 24 25 26 28 23   GLUCOSE 131* 116* 120* 143* 162*  BUN 14 8 7* 9 15  CREATININE 0.81 0.56 0.66 0.87 0.70  CALCIUM 8.4* 8.5* 8.9 8.9 8.7*   Liver Function Tests: Recent Labs  Lab 04/25/22 1311 04/26/22 0544 04/27/22 0500  AST 290* 144* 59*  ALT 307* 223* 147*  ALKPHOS 124 104 102  BILITOT 1.3* 1.0 0.5  PROT 7.5 6.1* 6.0*  ALBUMIN 4.4 3.5 3.2*   CBG: No results for input(s): GLUCAP  in the last 168 hours.  Discharge time spent: greater than 30 minutes.  Signed: Sharen Hones, MD [facility] 05/01/2022

## 2022-05-01 NOTE — Discharge Instructions (Signed)
In addition to included general post-operative instructions,  Diet: Resume home diet. Recommend avoiding or limiting fatty/greasy foods over the next few days/week. If you do eat these, you may (or may not) notice diarrhea. This is expected while your body adjusts to not having a gallbladder, and it typically resolves with time.    Activity: No heavy lifting >20 pounds (children, pets, laundry, garbage) for 6 weeks, but light activity and walking are encouraged. Do not drive or drink alcohol if taking narcotic pain medications or having pain that might distract from driving.  Wound care: 2 days after surgery (06/01), you may shower/get incision wet with soapy water and pat dry (do not rub incisions), but no baths or submerging incision underwater until follow-up.   Medications: Resume all home medications. For mild to moderate pain: acetaminophen (Tylenol) or ibuprofen/naproxen (if no kidney disease). Combining Tylenol with alcohol can substantially increase your risk of causing liver disease. Narcotic pain medications, if prescribed, can be used for severe pain, though may cause nausea, constipation, and drowsiness. Do not combine Tylenol and Percocet (or similar) within a 6 hour period as Percocet (and similar) contain(s) Tylenol. If you do not need the narcotic pain medication, you do not need to fill the prescription.  Call office [phone] / [phone]) at any time if any questions, worsening pain, fevers/chills, bleeding, drainage from incision site, or other concerns.

## 2022-05-06 DIAGNOSIS — K8042 Calculus of bile duct with acute cholecystitis without obstruction: Secondary | ICD-10-CM | POA: Diagnosis not present

## 2022-05-06 DIAGNOSIS — K851 Biliary acute pancreatitis without necrosis or infection: Secondary | ICD-10-CM | POA: Diagnosis not present

## 2022-05-06 DIAGNOSIS — R7989 Other specified abnormal findings of blood chemistry: Secondary | ICD-10-CM | POA: Diagnosis not present

## 2022-05-06 DIAGNOSIS — Z9049 Acquired absence of other specified parts of digestive tract: Secondary | ICD-10-CM | POA: Diagnosis not present

## 2022-05-08 ENCOUNTER — Encounter: Payer: Self-pay | Admitting: Surgery

## 2022-05-23 ENCOUNTER — Ambulatory Visit (INDEPENDENT_AMBULATORY_CARE_PROVIDER_SITE_OTHER): Payer: PPO | Admitting: Physician Assistant

## 2022-05-23 ENCOUNTER — Encounter: Payer: Self-pay | Admitting: Physician Assistant

## 2022-05-23 VITALS — BP 165/86 | HR 75 | Temp 98.6°F | Ht 63.0 in | Wt 134.6 lb

## 2022-05-23 DIAGNOSIS — K430 Incisional hernia with obstruction, without gangrene: Secondary | ICD-10-CM

## 2022-05-23 DIAGNOSIS — Z09 Encounter for follow-up examination after completed treatment for conditions other than malignant neoplasm: Secondary | ICD-10-CM

## 2022-05-23 DIAGNOSIS — K812 Acute cholecystitis with chronic cholecystitis: Secondary | ICD-10-CM

## 2022-05-23 DIAGNOSIS — K810 Acute cholecystitis: Secondary | ICD-10-CM

## 2022-05-23 NOTE — Progress Notes (Signed)
[facility] POST-OP OFFICE VISIT  05/23/2022  HPI: Debra Gutierrez is a 88 y.o. female 23 days s/p robotic assisted laparoscopic cholecystectomy and ventral hernia repair for acute on chronic cholecystitis and chronically incarcerated ventral hernia containing fat with Dr Everlene Farrier.   She has done remarkably well No abdominal pain, fever, chills, nausea, emesis She reports chronic diarrhea but recently started probiotic which is helping No issues with incisions Tolerating PO Ambulating well  Vital signs: BP (!) 165/86   Pulse 75   Temp 98.6 F (37 C) (Oral)   Ht 5\' 3"  (1.6 m)   Wt 134 lb 9.6 oz (61.1 kg)   SpO2 97%   BMI 23.84 kg/m    Physical Exam: Constitutional: Well appearing female, NAD Abdomen: Soft, non-tender, non-distended, no rebound/guarding Skin: Laparoscopic incisions are healing well, no erythema or drainage   Assessment/Plan: This is a 88 y.o. female 23 days s/p robotic assisted laparoscopic cholecystectomy and ventral hernia repair for acute on chronic cholecystitis and chronically incarcerated ventral hernia containing fat   - Pain control prn  - Reviewed wound care recommendation  - Reviewed lifting restrictions; 6 weeks total  - Reviewed surgical pathology; subacute on chronic cholecystitis; negative for malignancy   - She can follow up on as needed basis; She understands to call with questions/concerns  -- 95, PA-C [facility] 05/23/2022, 1:39 PM M-F: 7am - 4pm

## 2022-05-24 DIAGNOSIS — Z9049 Acquired absence of other specified parts of digestive tract: Secondary | ICD-10-CM | POA: Diagnosis not present

## 2022-05-24 DIAGNOSIS — K5790 Diverticulosis of intestine, part unspecified, without perforation or abscess without bleeding: Secondary | ICD-10-CM | POA: Diagnosis not present

## 2022-05-24 DIAGNOSIS — K5909 Other constipation: Secondary | ICD-10-CM | POA: Diagnosis not present

## 2022-05-24 DIAGNOSIS — K649 Unspecified hemorrhoids: Secondary | ICD-10-CM | POA: Diagnosis not present

## 2022-06-11 DIAGNOSIS — E782 Mixed hyperlipidemia: Secondary | ICD-10-CM | POA: Diagnosis not present

## 2022-06-11 DIAGNOSIS — R7303 Prediabetes: Secondary | ICD-10-CM | POA: Diagnosis not present

## 2022-06-11 DIAGNOSIS — I10 Essential (primary) hypertension: Secondary | ICD-10-CM | POA: Diagnosis not present

## 2022-06-12 DIAGNOSIS — R7303 Prediabetes: Secondary | ICD-10-CM | POA: Diagnosis not present

## 2022-06-18 DIAGNOSIS — M159 Polyosteoarthritis, unspecified: Secondary | ICD-10-CM | POA: Diagnosis not present

## 2022-06-18 DIAGNOSIS — E538 Deficiency of other specified B group vitamins: Secondary | ICD-10-CM | POA: Diagnosis not present

## 2022-06-18 DIAGNOSIS — I10 Essential (primary) hypertension: Secondary | ICD-10-CM | POA: Diagnosis not present

## 2022-06-18 DIAGNOSIS — E782 Mixed hyperlipidemia: Secondary | ICD-10-CM | POA: Diagnosis not present

## 2022-06-18 DIAGNOSIS — R7303 Prediabetes: Secondary | ICD-10-CM | POA: Diagnosis not present

## 2022-06-18 DIAGNOSIS — M810 Age-related osteoporosis without current pathological fracture: Secondary | ICD-10-CM | POA: Diagnosis not present

## 2022-06-18 DIAGNOSIS — G500 Trigeminal neuralgia: Secondary | ICD-10-CM | POA: Diagnosis not present

## 2022-06-18 DIAGNOSIS — Z0000 Encounter for general adult medical examination without abnormal findings: Secondary | ICD-10-CM | POA: Diagnosis not present

## 2022-07-15 DIAGNOSIS — E782 Mixed hyperlipidemia: Secondary | ICD-10-CM | POA: Diagnosis not present

## 2022-07-15 DIAGNOSIS — R7303 Prediabetes: Secondary | ICD-10-CM | POA: Diagnosis not present

## 2022-07-15 DIAGNOSIS — I10 Essential (primary) hypertension: Secondary | ICD-10-CM | POA: Diagnosis not present

## 2022-07-15 DIAGNOSIS — R42 Dizziness and giddiness: Secondary | ICD-10-CM | POA: Diagnosis not present

## 2022-07-15 DIAGNOSIS — I491 Atrial premature depolarization: Secondary | ICD-10-CM | POA: Diagnosis not present

## 2022-09-23 DIAGNOSIS — G939 Disorder of brain, unspecified: Secondary | ICD-10-CM | POA: Diagnosis not present

## 2022-09-23 DIAGNOSIS — H04202 Unspecified epiphora, left lacrimal gland: Secondary | ICD-10-CM | POA: Diagnosis not present

## 2022-09-23 DIAGNOSIS — H9193 Unspecified hearing loss, bilateral: Secondary | ICD-10-CM | POA: Diagnosis not present

## 2022-09-23 DIAGNOSIS — G500 Trigeminal neuralgia: Secondary | ICD-10-CM | POA: Diagnosis not present

## 2022-09-27 IMAGING — MR MR 3D RECON AT SCANNER
21 series · 21 of 21 positions shown · IV contrast (gadavist)
Comparison: Right upper quadrant ultrasound dated 04/25/2022. CTA
abdomen/pelvis dated 03/16/2022.

CLINICAL DATA: Right upper quadrant abdominal pain. Possible acute
cholecystitis on ultrasound. Dilated common duct, raising the
possibility of choledocholithiasis.



[Series 3: T2 · coronal · 6.0mm · 1.19mm/px · 1 of 30 slices shown (1 of 2)]
[im 1/30]
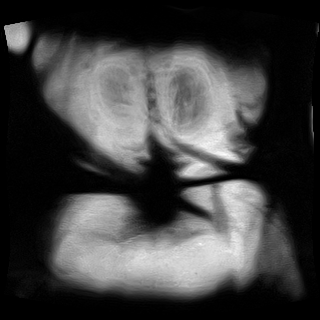

[Series 4: T2 · axial · 6.0mm · 1.19mm/px · 1 of 32 slices shown (2 of 2)]
[im 1/32]
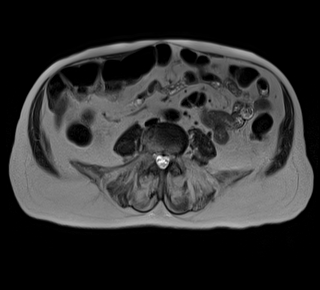

[Series 5: in & out · axial · 3.0mm · 1.19mm/px · 1 of 72 slices shown (1 of 2)]
[im 1/72]
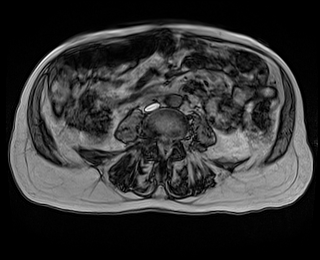

[Series 6: in & out · axial · 3.0mm · 1.19mm/px · 1 of 72 slices shown (2 of 2)]
[im 1/72]
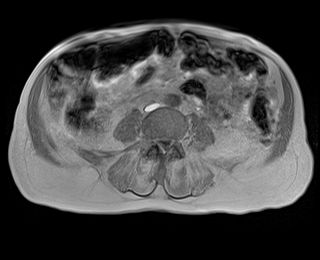

[Series 10: ax dwi_tracew · axial · 6.0mm · 1.42mm/px · 1 of 90 slices shown]
[im 1/90]
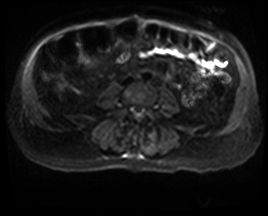

[Series 11: ax dwi_adc · axial · 6.0mm · 1.42mm/px · 1 of 30 slices shown]
[im 1/30]
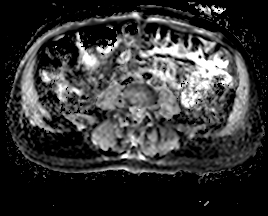

[Series 12: MRCP · coronal · 1.3mm · 0.59mm/px · 1 of 64 slices shown (1 of 2)]
[im 1/64]
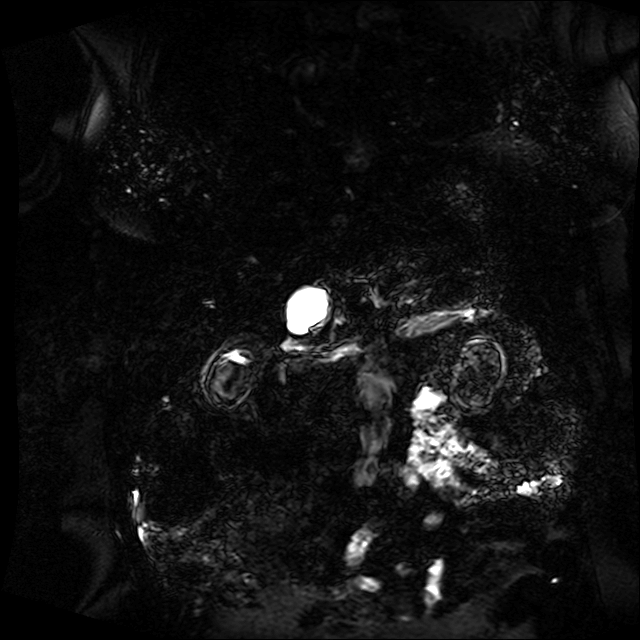

[Series 13: 3d mrcp_mip_radials · sagittal · 0.59mm/px · 1 of 19 slices shown]
[im 1/19]
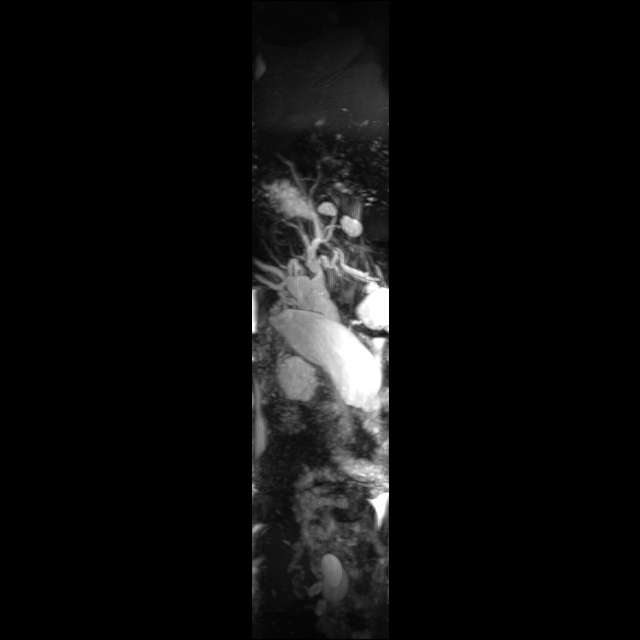

[Series 14: MRCP · coronal · 3.0mm · 1.12mm/px · 1 of 17 slices shown (2 of 2)]
[im 1/17]
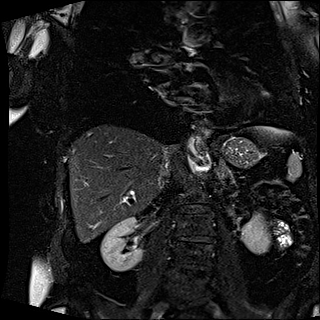

[Series 15: radials · coronal · 50.0mm · 0.78mm/px · 1 of 5 slices shown]
[im 1/5]
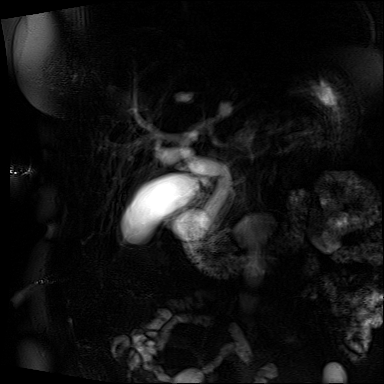

[Series 16: T1 dynamic fat-sat · axial · non-contrast · 3.0mm · 1.19mm/px · 1 of 80 slices shown (1 of 5)]
[im 1/80]
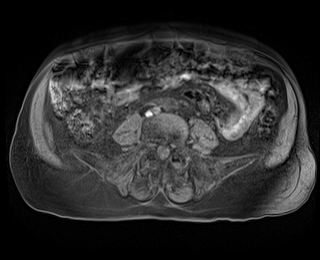

[Series 17: T1 dynamic fat-sat post-contrast · axial · 3.0mm · 1.19mm/px · 1 of 80 slices shown (1 of 4)]
[im 1/80]
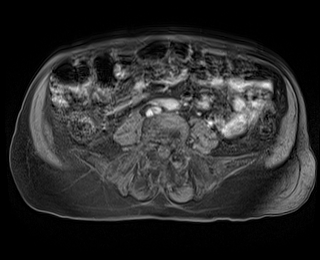

[Series 18: T1 dynamic fat-sat · axial · 3.0mm · 1.19mm/px · 1 of 80 slices shown (2 of 5)]
[im 1/80]
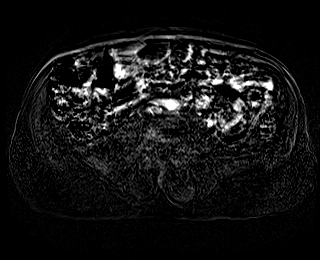

[Series 19: T1 dynamic fat-sat post-contrast · axial · 3.0mm · 1.19mm/px · 1 of 80 slices shown (2 of 4)]
[im 1/80]
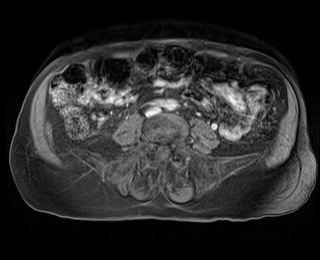

[Series 20: T1 dynamic fat-sat · axial · 3.0mm · 1.19mm/px · 1 of 80 slices shown (3 of 5)]
[im 1/80]
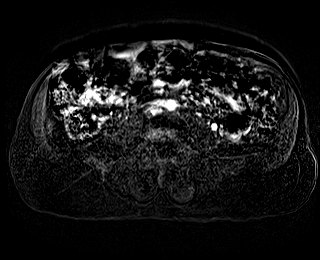

[Series 21: T1 dynamic fat-sat post-contrast · axial · 3.0mm · 1.19mm/px · 1 of 80 slices shown (3 of 4)]
[im 1/80]
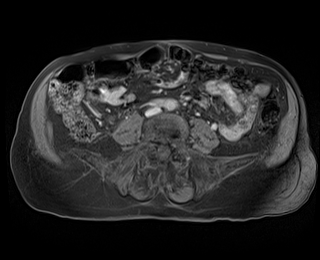

[Series 22: T1 dynamic fat-sat · axial · 3.0mm · 1.19mm/px · 1 of 80 slices shown (4 of 5)]
[im 1/80]
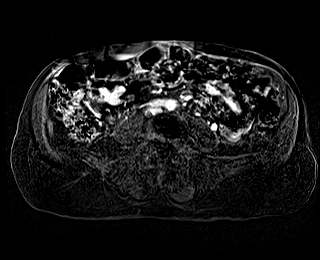

[Series 23: T1 dynamic post-contrast · coronal · 3.0mm · 1.31mm/px · 1 of 72 slices shown]
[im 1/72]
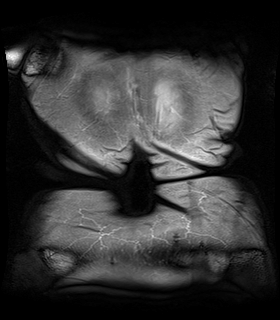

[Series 24: T1 dynamic fat-sat post-contrast · axial · 3.0mm · 1.19mm/px · 1 of 80 slices shown (4 of 4)]
[im 1/80]
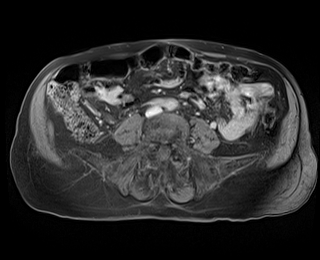

[Series 25: T1 dynamic fat-sat · axial · 3.0mm · 1.19mm/px · 1 of 80 slices shown (5 of 5)]
[im 1/80]
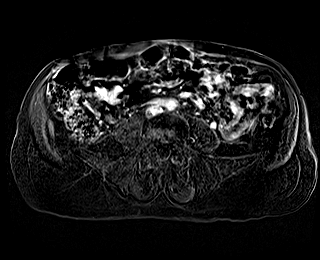

[Series 1036: mip · oblique · 0.6mm · 0.43mm/px · 1 of 3 slices shown]
[im 1/3]
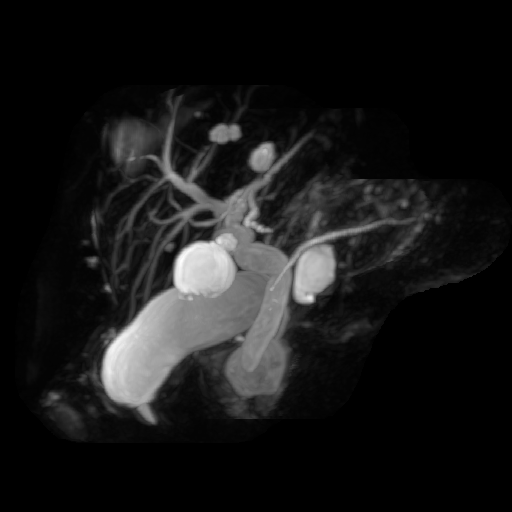

[21 of 21 positions shown; findings below may reference images not displayed]

FINDINGS: Motion degraded images.

Lower chest: Lung bases are clear.

Hepatobiliary: Scattered hepatic cysts, measuring up to 3.0 cm in
segment 3 (series 21/image 45). No suspicious/enhancing hepatic
lesions.

Layering tiny gallstones (series 4/image 21), with mild gallbladder
wall thickening (series 4/image 26), but no convincing findings of
acute cholecystitis.

Very mild central intrahepatic ductal dilatation, similar to the
prior. Dilated common duct, measuring 12 mm (series 3/image 22),
previously 10 mm on CT. No choledocholithiasis is seen.

Pancreas:  Within normal limits.

Spleen:  Within normal limits.

Adrenals/Urinary Tract:  Adrenal glands are within normal limits.

Kidneys are within normal limits.  No hydronephrosis.

Stomach/Bowel: Stomach and visualized bowel are grossly
unremarkable.

Vascular/Lymphatic:  No evidence of abdominal aortic aneurysm.

No suspicious abdominal lymphadenopathy.

Other:  No abdominal ascites.

Musculoskeletal: No focal osseous lesions.
IMPRESSION: Motion degraded images.

Cholelithiasis with mild gallbladder wall thickening, but without
convincing findings of acute cholecystitis.

Dilated common duct, measuring 12 mm, minimally progressive from
prior CT. No choledocholithiasis is seen.

## 2022-09-27 IMAGING — US US ABDOMEN LIMITED
1 series · 14 of 25 positions shown · non-contrast
Comparison: Abdominal ultrasound dated September 24, 2019

CLINICAL DATA: Abdominal pain

EXAM:
ULTRASOUND ABDOMEN LIMITED RIGHT UPPER QUADRANT

[Series 1: us abdomen limited ruq (liver/gb) · 14 of 89 slices shown]
[im 1/89]
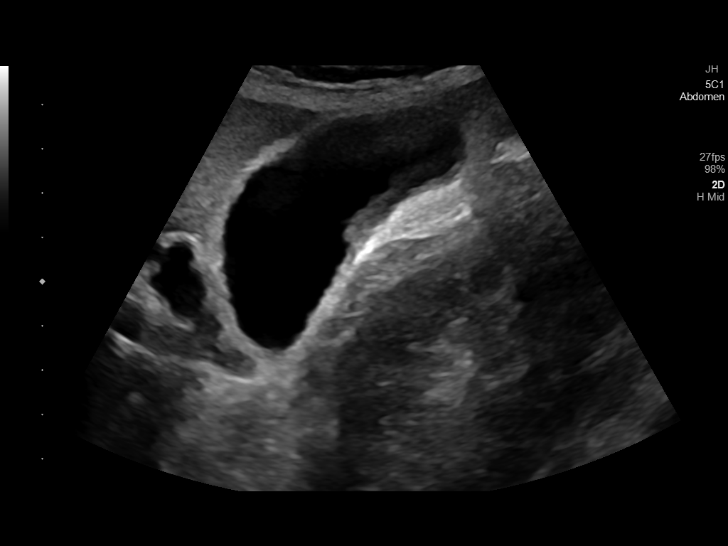
[im 8/89]
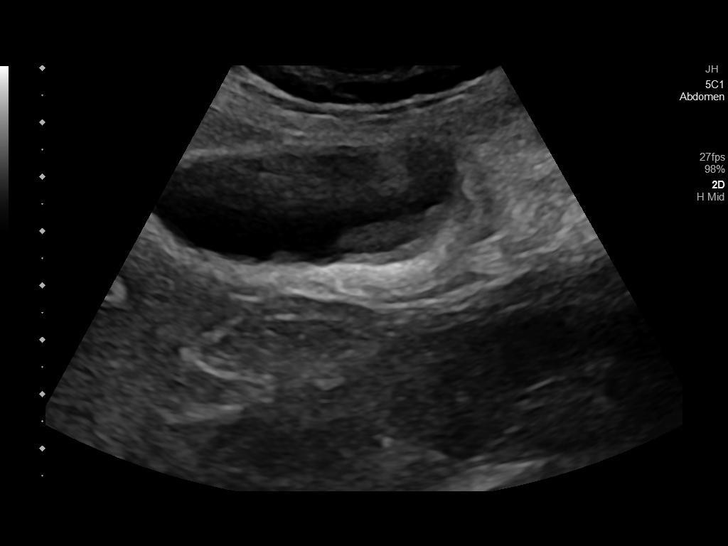
[im 15/89]
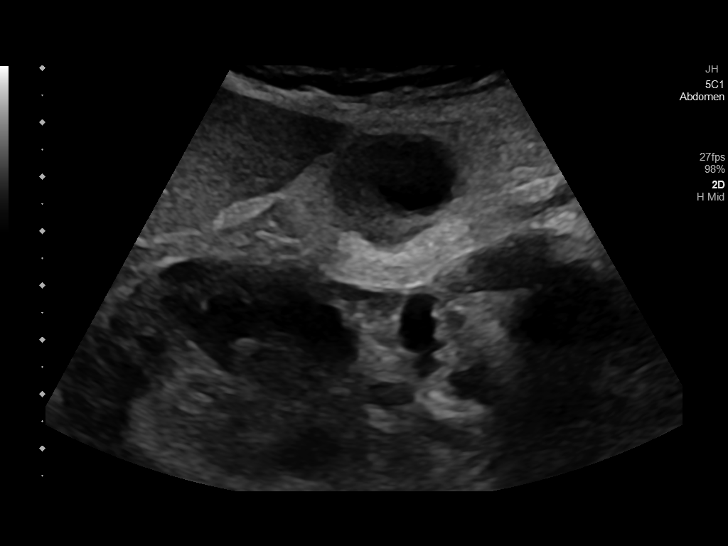
[im 23/89]
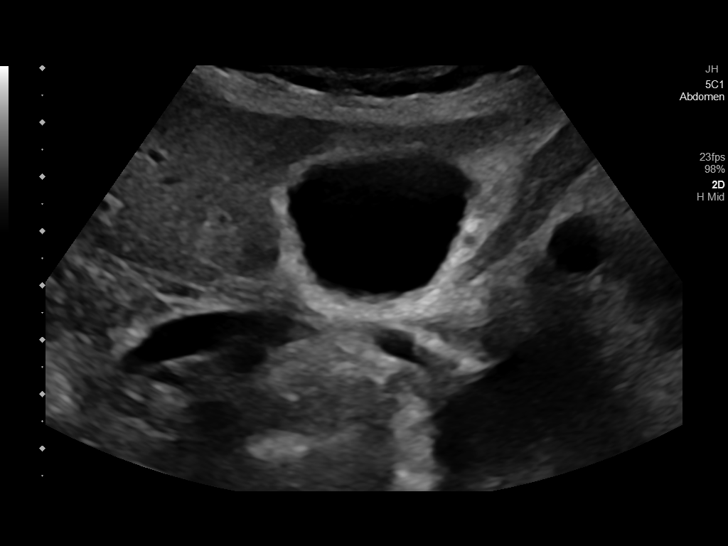
[im 30/89]
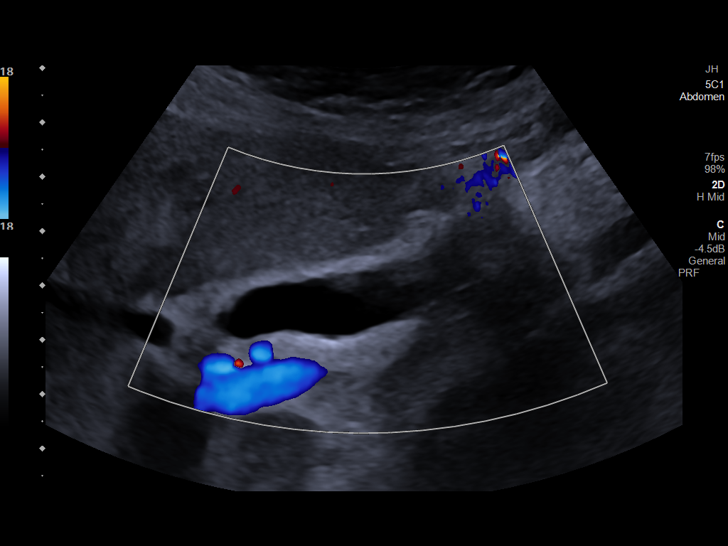
[im 34/89]
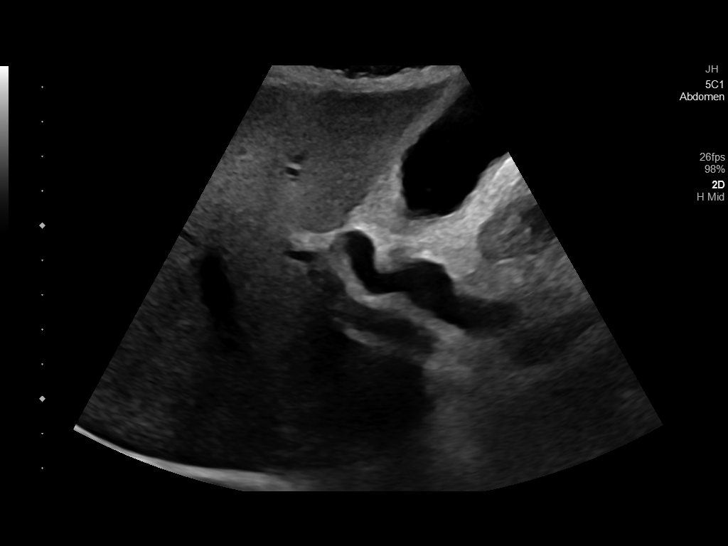
[im 41/89]
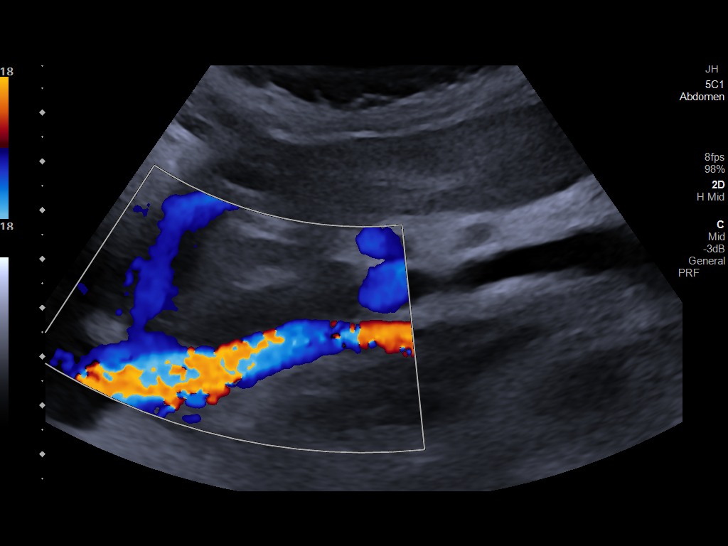
[im 48/89]
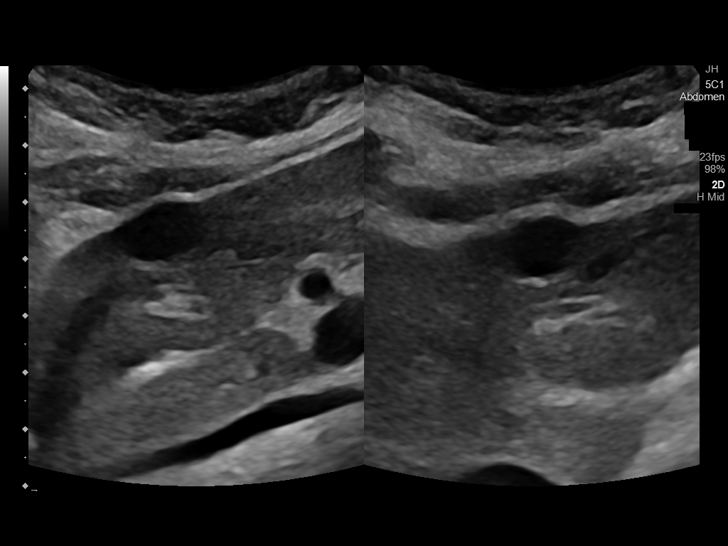
[im 56/89]
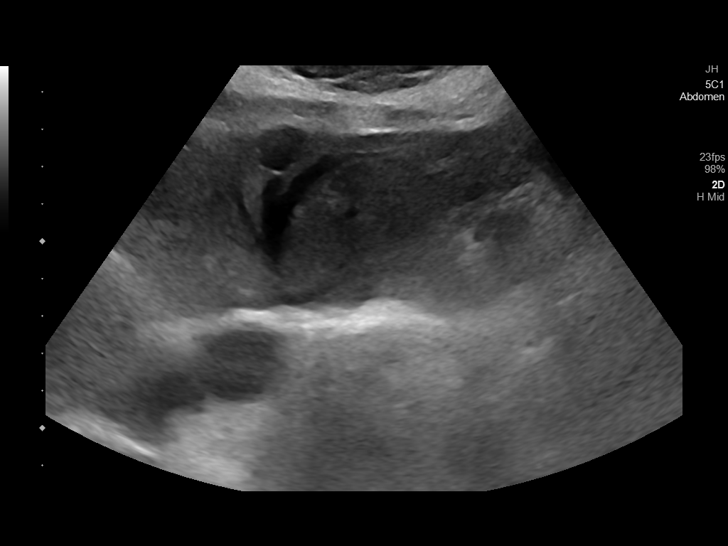
[im 59/89]
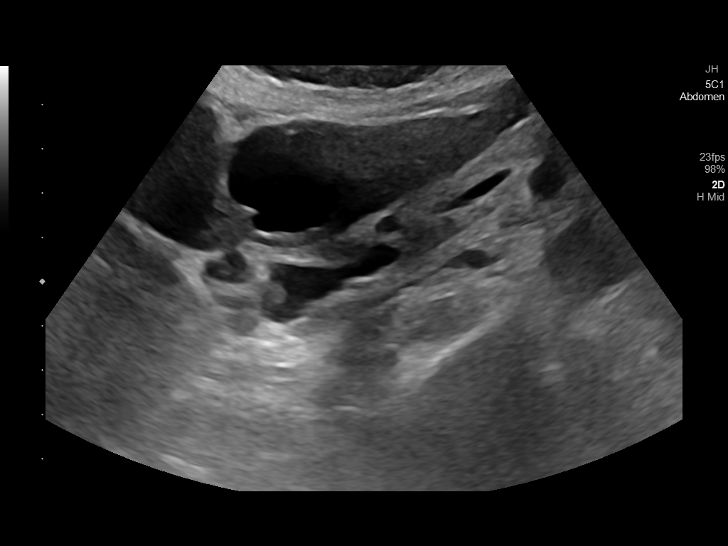
[im 67/89]
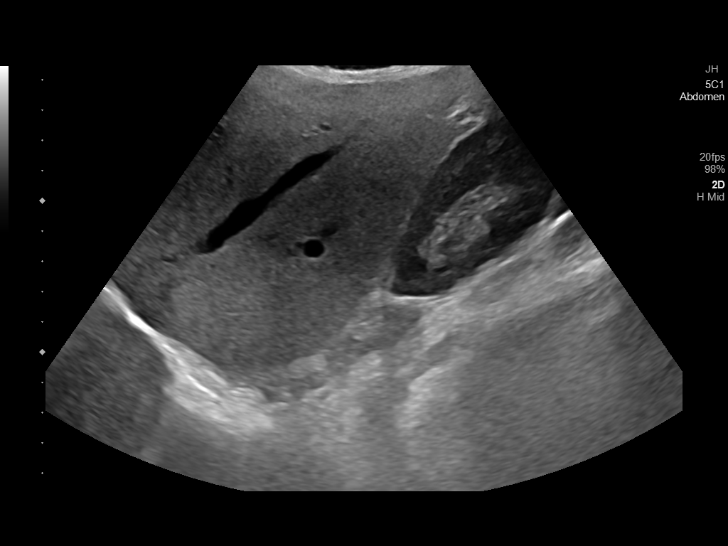
[im 74/89]
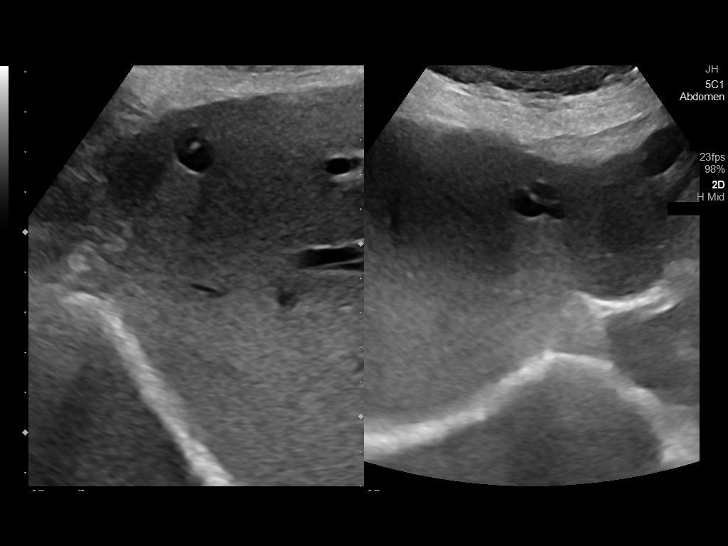
[im 81/89]
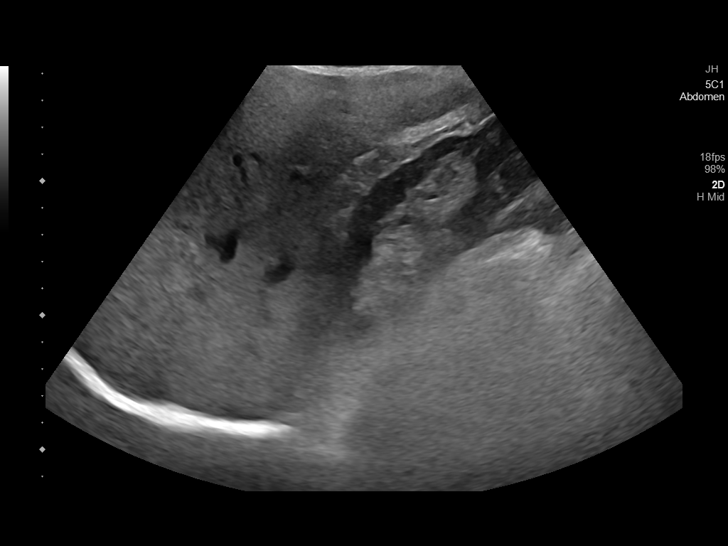
[im 89/89]
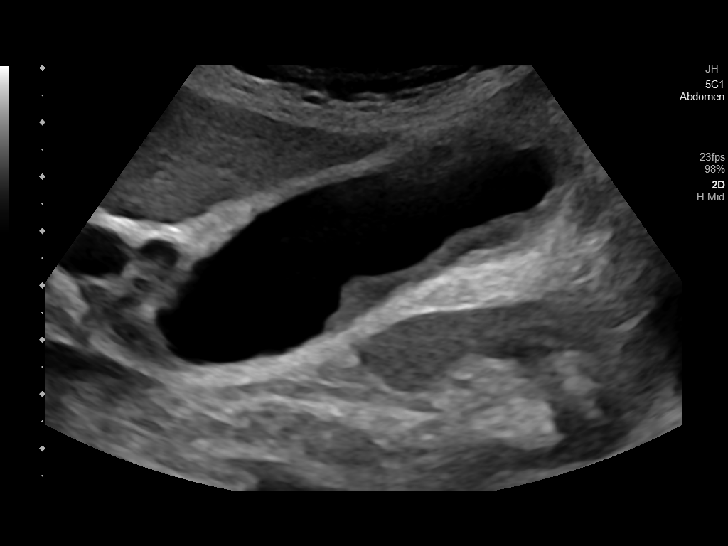

[14 of 25 positions shown; findings below may reference images not displayed]

FINDINGS: Gallbladder:

No gallstones. Mild gallbladder wall thickening. Sludge is seen in
the gallbladder. No sonographic Murphy sign noted by sonographer.

Common bile duct:

Diameter:

Liver:

Simple appearing cyst of the left lobe of the liver measuring up to
4.3 cm and 1.7 cm. Heterogeneous echotexture with increased
parenchymal echogenicity. Portal vein is patent on color Doppler
imaging with normal direction of blood flow towards the liver.

Other: None.
IMPRESSION: 1. Dilated common bile duct which is concerning for
choledocholithiasis. Recommend MRCP for further evaluation.
2. Mild gallbladder wall thickening and sludge with negative
sonographic Murphy sign, findings are equivocal for acute
cholecystitis.
3. Hepatic steatosis.

## 2022-09-27 IMAGING — MR MR ABDOMEN WO/W CM MRCP
19 of 20 series · 46 of 48 positions shown · IV contrast (gadavist)
Comparison: Right upper quadrant ultrasound dated 04/25/2022. CTA
abdomen/pelvis dated 03/16/2022.

CLINICAL DATA: Right upper quadrant abdominal pain. Possible acute
cholecystitis on ultrasound. Dilated common duct, raising the
possibility of choledocholithiasis.



[Series 3: T2 · coronal · 6.0mm · 1.19mm/px · 1 of 30 slices shown (1 of 2)]
[im 1/30]
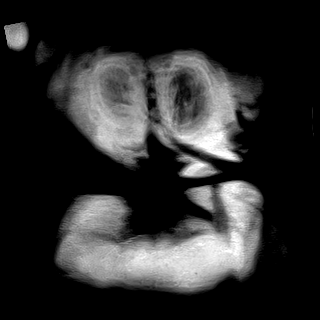

[Series 4: T2 · axial · 6.0mm · 1.19mm/px · 1 of 32 slices shown (2 of 2)]
[im 1/32]
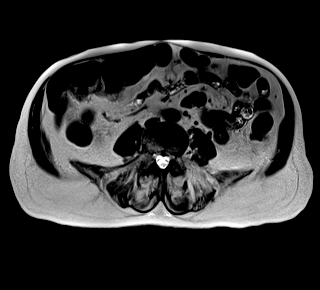

[Series 5: in & out · axial · 3.0mm · 1.19mm/px · z∈[-97,+116]mm · 3 of 72 slices shown (1 of 2)]
[im 1/72]
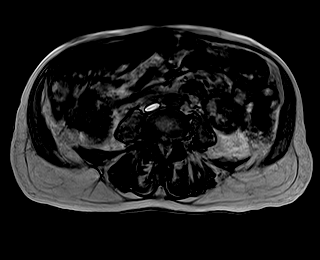
[im 36/72]
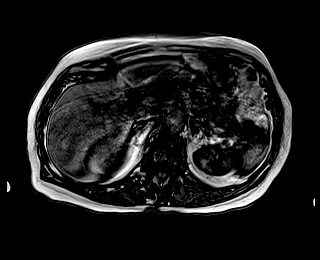
[im 72/72]
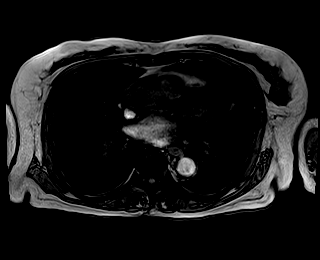

[Series 6: in & out · axial · 3.0mm · 1.19mm/px · z∈[-97,+116]mm · 3 of 72 slices shown (2 of 2)]
[im 1/72]
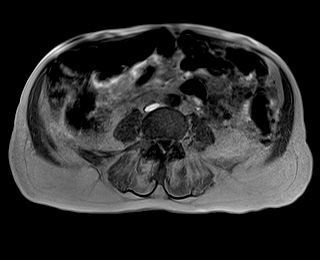
[im 36/72]
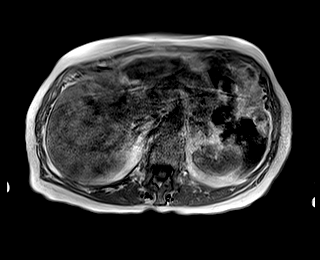
[im 72/72]
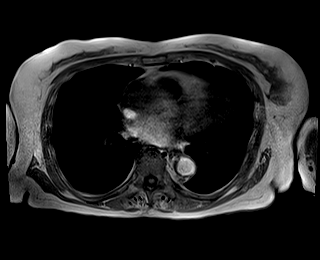

[Series 9: T2 fat-sat · axial · 6.0mm · 1.19mm/px · 1 of 30 slices shown]
[im 1/30]
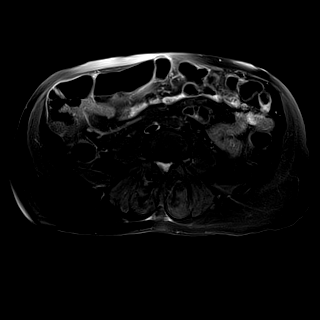

[Series 10: ax dwi_tracew · axial · 6.0mm · 1.42mm/px · z∈[-97,+112]mm · 4 of 90 slices shown]
[im 1/90]
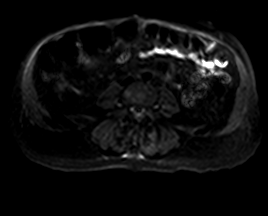
[im 30/90]
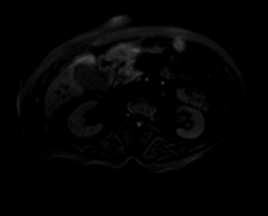
[im 60/90]
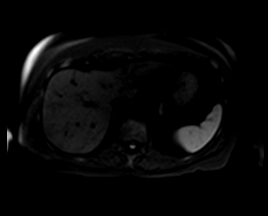
[im 90/90]
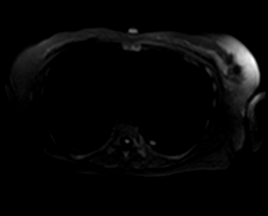

[Series 11: ax dwi_adc · axial · 6.0mm · 1.42mm/px · 1 of 30 slices shown]
[im 1/30]
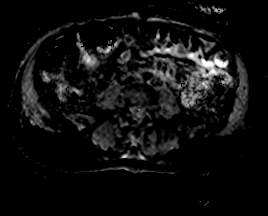

[Series 15: MRCP · coronal · 3.0mm · 1.12mm/px · 1 of 17 slices shown]
[im 1/17]
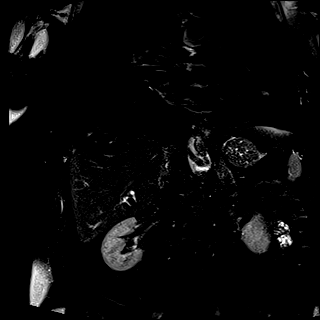

[Series 16: radials · coronal · 50.0mm · 0.78mm/px · 1 of 5 slices shown]
[im 1/5]
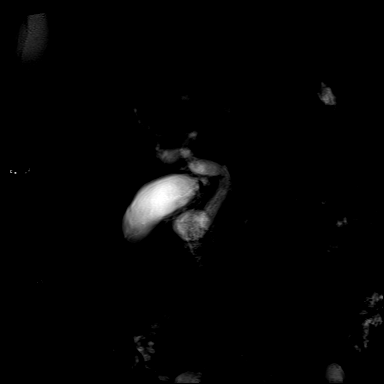

[Series 17: T1 dynamic fat-sat · axial · non-contrast · 3.0mm · 1.19mm/px · z∈[-103,+134]mm · 3 of 80 slices shown (1 of 5)]
[im 1/80]
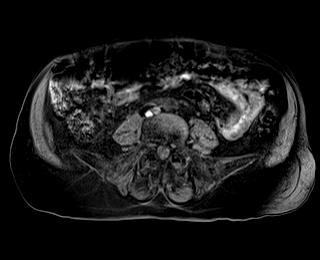
[im 40/80]
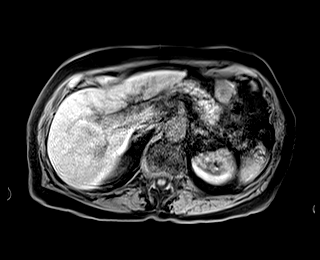
[im 80/80]
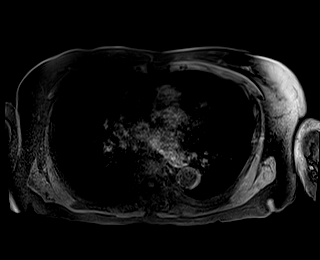

[Series 18: T1 dynamic fat-sat post-contrast · axial · 3.0mm · 1.19mm/px · z∈[-103,+134]mm · 3 of 80 slices shown (1 of 4)]
[im 1/80]
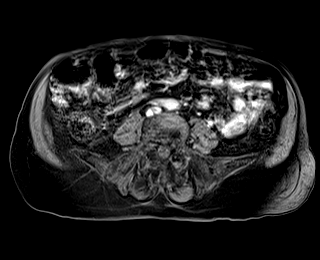
[im 40/80]
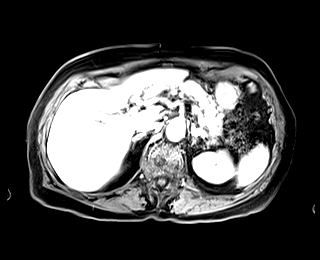
[im 80/80]
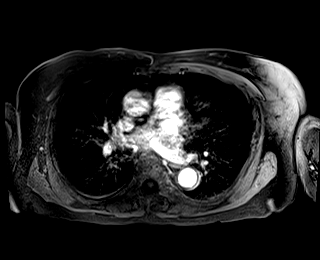

[Series 19: T1 dynamic fat-sat · axial · 3.0mm · 1.19mm/px · z∈[-103,+134]mm · 3 of 80 slices shown (2 of 5)]
[im 1/80]
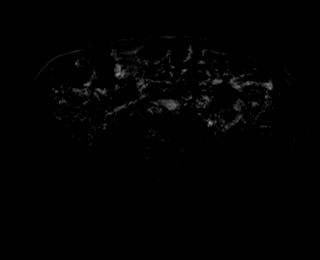
[im 40/80]
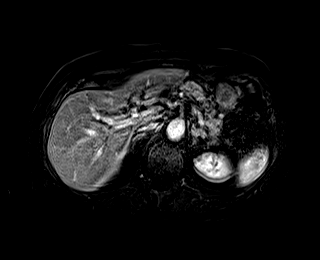
[im 80/80]
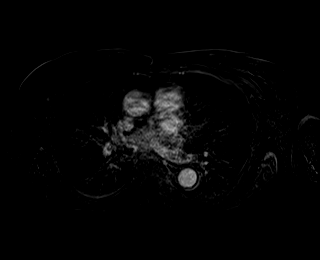

[Series 20: T1 dynamic fat-sat post-contrast · axial · 3.0mm · 1.19mm/px · z∈[-103,+134]mm · 3 of 80 slices shown (2 of 4)]
[im 1/80]
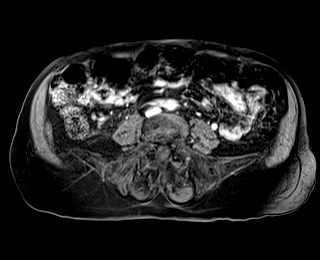
[im 40/80]
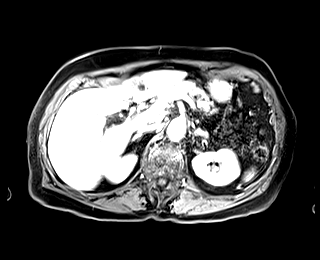
[im 80/80]
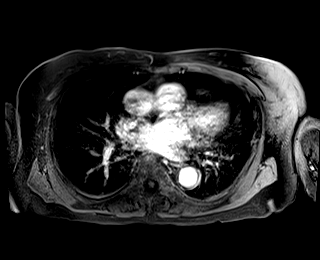

[Series 21: T1 dynamic fat-sat · axial · 3.0mm · 1.19mm/px · z∈[-103,+134]mm · 3 of 80 slices shown (3 of 5)]
[im 1/80]
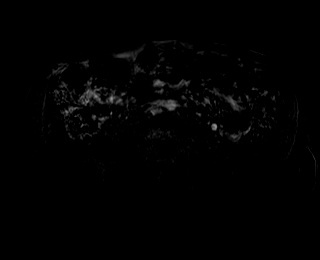
[im 40/80]
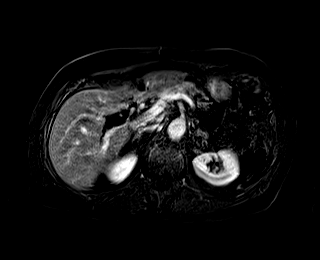
[im 80/80]
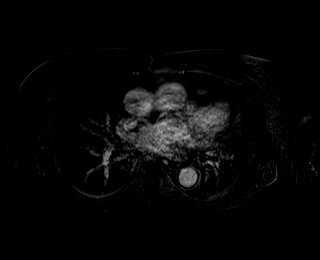

[Series 22: T1 dynamic fat-sat post-contrast · axial · 3.0mm · 1.19mm/px · z∈[-103,+134]mm · 3 of 80 slices shown (3 of 4)]
[im 1/80]
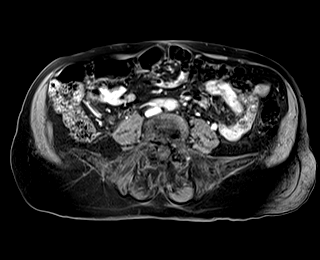
[im 40/80]
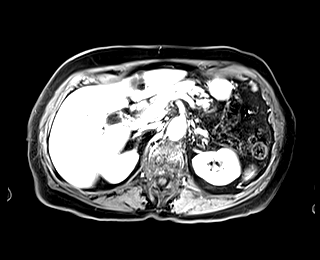
[im 80/80]
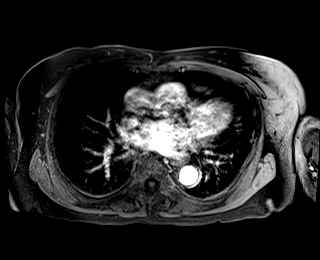

[Series 23: T1 dynamic fat-sat · axial · 3.0mm · 1.19mm/px · z∈[-103,+134]mm · 3 of 80 slices shown (4 of 5)]
[im 1/80]
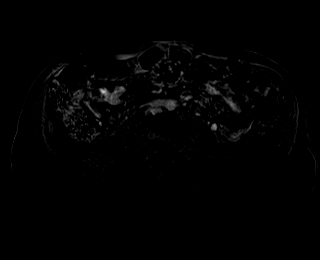
[im 40/80]
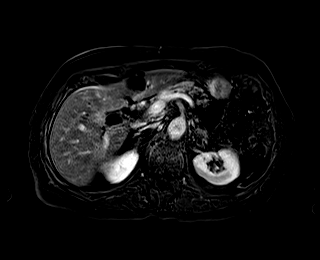
[im 80/80]
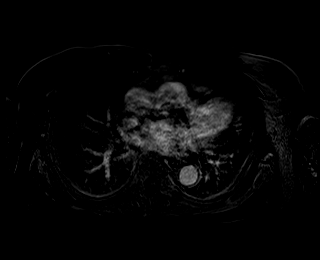

[Series 24: T1 dynamic post-contrast · coronal · 3.0mm · 1.31mm/px · 3 of 72 slices shown]
[im 1/72]
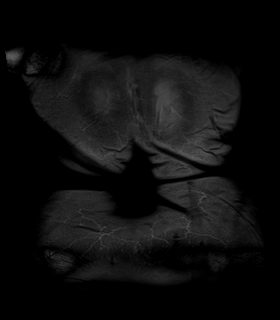
[im 36/72]
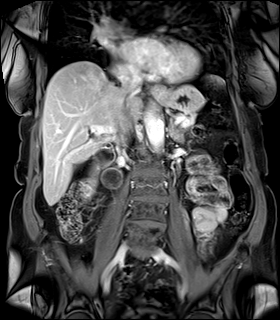
[im 72/72]
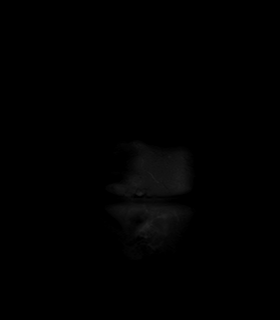

[Series 25: T1 dynamic fat-sat post-contrast · axial · 3.0mm · 1.19mm/px · z∈[-103,+134]mm · 3 of 80 slices shown (4 of 4)]
[im 1/80]
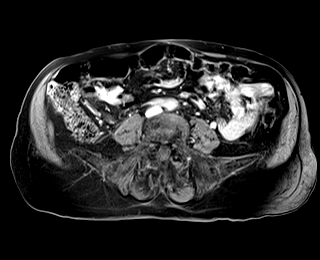
[im 40/80]
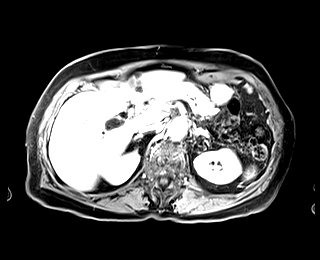
[im 80/80]
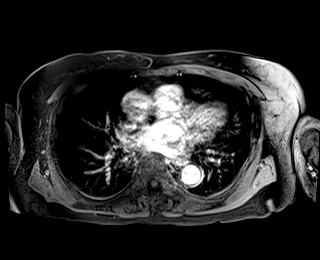

[Series 26: T1 dynamic fat-sat · axial · 3.0mm · 1.19mm/px · z∈[-103,+134]mm · 3 of 80 slices shown (5 of 5)]
[im 1/80]
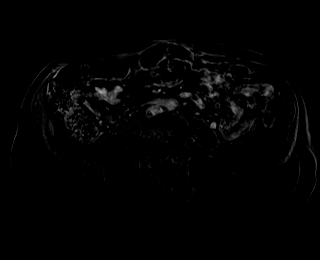
[im 40/80]
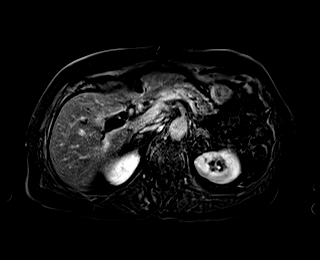
[im 80/80]
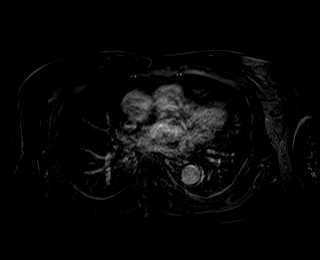

[46 of 48 positions shown; findings below may reference images not displayed]

FINDINGS: Motion degraded images.

Lower chest: Lung bases are clear.

Hepatobiliary: Scattered hepatic cysts, measuring up to 3.0 cm in
segment 3 (series 21/image 45). No suspicious/enhancing hepatic
lesions.

Layering tiny gallstones (series 4/image 21), with mild gallbladder
wall thickening (series 4/image 26), but no convincing findings of
acute cholecystitis.

Very mild central intrahepatic ductal dilatation, similar to the
prior. Dilated common duct, measuring 12 mm (series 3/image 22),
previously 10 mm on CT. No choledocholithiasis is seen.

Pancreas:  Within normal limits.

Spleen:  Within normal limits.

Adrenals/Urinary Tract:  Adrenal glands are within normal limits.

Kidneys are within normal limits.  No hydronephrosis.

Stomach/Bowel: Stomach and visualized bowel are grossly
unremarkable.

Vascular/Lymphatic:  No evidence of abdominal aortic aneurysm.

No suspicious abdominal lymphadenopathy.

Other:  No abdominal ascites.

Musculoskeletal: No focal osseous lesions.
IMPRESSION: Motion degraded images.

Cholelithiasis with mild gallbladder wall thickening, but without
convincing findings of acute cholecystitis.

Dilated common duct, measuring 12 mm, minimally progressive from
prior CT. No choledocholithiasis is seen.

## 2022-12-19 DIAGNOSIS — E538 Deficiency of other specified B group vitamins: Secondary | ICD-10-CM | POA: Diagnosis not present

## 2022-12-19 DIAGNOSIS — G500 Trigeminal neuralgia: Secondary | ICD-10-CM | POA: Diagnosis not present

## 2022-12-19 DIAGNOSIS — E782 Mixed hyperlipidemia: Secondary | ICD-10-CM | POA: Diagnosis not present

## 2022-12-19 DIAGNOSIS — R7303 Prediabetes: Secondary | ICD-10-CM | POA: Diagnosis not present

## 2022-12-19 DIAGNOSIS — M810 Age-related osteoporosis without current pathological fracture: Secondary | ICD-10-CM | POA: Diagnosis not present

## 2022-12-19 DIAGNOSIS — I10 Essential (primary) hypertension: Secondary | ICD-10-CM | POA: Diagnosis not present

## 2022-12-19 DIAGNOSIS — R42 Dizziness and giddiness: Secondary | ICD-10-CM | POA: Diagnosis not present

## 2022-12-19 DIAGNOSIS — M159 Polyosteoarthritis, unspecified: Secondary | ICD-10-CM | POA: Diagnosis not present

## 2023-01-15 DIAGNOSIS — I10 Essential (primary) hypertension: Secondary | ICD-10-CM | POA: Diagnosis not present

## 2023-01-15 DIAGNOSIS — R7303 Prediabetes: Secondary | ICD-10-CM | POA: Diagnosis not present

## 2023-01-15 DIAGNOSIS — I491 Atrial premature depolarization: Secondary | ICD-10-CM | POA: Diagnosis not present

## 2023-01-15 DIAGNOSIS — E782 Mixed hyperlipidemia: Secondary | ICD-10-CM | POA: Diagnosis not present

## 2023-01-15 DIAGNOSIS — R42 Dizziness and giddiness: Secondary | ICD-10-CM | POA: Diagnosis not present

## 2023-01-29 DIAGNOSIS — L859 Epidermal thickening, unspecified: Secondary | ICD-10-CM | POA: Diagnosis not present

## 2023-02-03 DIAGNOSIS — D0462 Carcinoma in situ of skin of left upper limb, including shoulder: Secondary | ICD-10-CM | POA: Diagnosis not present

## 2023-02-03 DIAGNOSIS — L821 Other seborrheic keratosis: Secondary | ICD-10-CM | POA: Diagnosis not present

## 2023-02-03 DIAGNOSIS — D485 Neoplasm of uncertain behavior of skin: Secondary | ICD-10-CM | POA: Diagnosis not present

## 2023-02-13 DIAGNOSIS — C44629 Squamous cell carcinoma of skin of left upper limb, including shoulder: Secondary | ICD-10-CM | POA: Diagnosis not present

## 2023-02-13 DIAGNOSIS — D485 Neoplasm of uncertain behavior of skin: Secondary | ICD-10-CM | POA: Diagnosis not present

## 2023-03-03 DIAGNOSIS — L988 Other specified disorders of the skin and subcutaneous tissue: Secondary | ICD-10-CM | POA: Diagnosis not present

## 2023-03-03 DIAGNOSIS — L578 Other skin changes due to chronic exposure to nonionizing radiation: Secondary | ICD-10-CM | POA: Diagnosis not present

## 2023-03-03 DIAGNOSIS — L814 Other melanin hyperpigmentation: Secondary | ICD-10-CM | POA: Diagnosis not present

## 2023-03-03 DIAGNOSIS — D0462 Carcinoma in situ of skin of left upper limb, including shoulder: Secondary | ICD-10-CM | POA: Diagnosis not present

## 2023-03-17 DIAGNOSIS — Z85828 Personal history of other malignant neoplasm of skin: Secondary | ICD-10-CM | POA: Diagnosis not present

## 2023-03-17 DIAGNOSIS — L820 Inflamed seborrheic keratosis: Secondary | ICD-10-CM | POA: Diagnosis not present

## 2023-05-06 DIAGNOSIS — Z85828 Personal history of other malignant neoplasm of skin: Secondary | ICD-10-CM | POA: Diagnosis not present

## 2023-05-06 DIAGNOSIS — L821 Other seborrheic keratosis: Secondary | ICD-10-CM | POA: Diagnosis not present

## 2023-06-16 DIAGNOSIS — I10 Essential (primary) hypertension: Secondary | ICD-10-CM | POA: Diagnosis not present

## 2023-06-16 DIAGNOSIS — R7303 Prediabetes: Secondary | ICD-10-CM | POA: Diagnosis not present

## 2023-06-16 DIAGNOSIS — E782 Mixed hyperlipidemia: Secondary | ICD-10-CM | POA: Diagnosis not present

## 2023-06-24 DIAGNOSIS — R7303 Prediabetes: Secondary | ICD-10-CM | POA: Diagnosis not present

## 2023-06-24 DIAGNOSIS — M810 Age-related osteoporosis without current pathological fracture: Secondary | ICD-10-CM | POA: Diagnosis not present

## 2023-06-24 DIAGNOSIS — E538 Deficiency of other specified B group vitamins: Secondary | ICD-10-CM | POA: Diagnosis not present

## 2023-06-24 DIAGNOSIS — G500 Trigeminal neuralgia: Secondary | ICD-10-CM | POA: Diagnosis not present

## 2023-06-24 DIAGNOSIS — E782 Mixed hyperlipidemia: Secondary | ICD-10-CM | POA: Diagnosis not present

## 2023-06-24 DIAGNOSIS — Z0000 Encounter for general adult medical examination without abnormal findings: Secondary | ICD-10-CM | POA: Diagnosis not present

## 2023-06-24 DIAGNOSIS — I10 Essential (primary) hypertension: Secondary | ICD-10-CM | POA: Diagnosis not present

## 2023-06-24 DIAGNOSIS — M159 Polyosteoarthritis, unspecified: Secondary | ICD-10-CM | POA: Diagnosis not present

## 2023-07-10 ENCOUNTER — Other Ambulatory Visit: Payer: Self-pay

## 2023-07-10 ENCOUNTER — Emergency Department: Payer: HMO

## 2023-07-10 ENCOUNTER — Emergency Department
Admission: EM | Admit: 2023-07-10 | Discharge: 2023-07-10 | Disposition: A | Discharge status: Home / Self Care | Payer: HMO | Attending: Emergency Medicine | Admitting: Emergency Medicine

## 2023-07-10 DIAGNOSIS — R9089 Other abnormal findings on diagnostic imaging of central nervous system: Secondary | ICD-10-CM | POA: Diagnosis not present

## 2023-07-10 DIAGNOSIS — I6782 Cerebral ischemia: Secondary | ICD-10-CM | POA: Diagnosis not present

## 2023-07-10 DIAGNOSIS — R079 Chest pain, unspecified: Secondary | ICD-10-CM | POA: Diagnosis not present

## 2023-07-10 DIAGNOSIS — S199XXA Unspecified injury of neck, initial encounter: Secondary | ICD-10-CM | POA: Diagnosis not present

## 2023-07-10 DIAGNOSIS — S0990XA Unspecified injury of head, initial encounter: Secondary | ICD-10-CM | POA: Insufficient documentation

## 2023-07-10 DIAGNOSIS — I700 Atherosclerosis of aorta: Secondary | ICD-10-CM | POA: Insufficient documentation

## 2023-07-10 DIAGNOSIS — W1830XA Fall on same level, unspecified, initial encounter: Secondary | ICD-10-CM | POA: Insufficient documentation

## 2023-07-10 DIAGNOSIS — Y92000 Kitchen of unspecified non-institutional (private) residence as  the place of occurrence of the external cause: Secondary | ICD-10-CM | POA: Diagnosis not present

## 2023-07-10 DIAGNOSIS — S51011A Laceration without foreign body of right elbow, initial encounter: Secondary | ICD-10-CM | POA: Diagnosis not present

## 2023-07-10 DIAGNOSIS — S51811A Laceration without foreign body of right forearm, initial encounter: Secondary | ICD-10-CM | POA: Diagnosis not present

## 2023-07-10 DIAGNOSIS — S51812A Laceration without foreign body of left forearm, initial encounter: Secondary | ICD-10-CM | POA: Insufficient documentation

## 2023-07-10 DIAGNOSIS — I2510 Atherosclerotic heart disease of native coronary artery without angina pectoris: Secondary | ICD-10-CM | POA: Insufficient documentation

## 2023-07-10 DIAGNOSIS — S299XXA Unspecified injury of thorax, initial encounter: Secondary | ICD-10-CM | POA: Diagnosis not present

## 2023-07-10 DIAGNOSIS — I10 Essential (primary) hypertension: Secondary | ICD-10-CM | POA: Diagnosis not present

## 2023-07-10 DIAGNOSIS — W19XXXA Unspecified fall, initial encounter: Secondary | ICD-10-CM

## 2023-07-10 DIAGNOSIS — J984 Other disorders of lung: Secondary | ICD-10-CM | POA: Diagnosis not present

## 2023-07-10 LAB — CBC
HCT: 42.5 % (ref 36.0–46.0)
Hemoglobin: 13.8 g/dL (ref 12.0–15.0)
MCH: 33.4 pg (ref 26.0–34.0)
MCHC: 32.5 g/dL (ref 30.0–36.0)
MCV: 102.9 fL — ABNORMAL HIGH (ref 80.0–100.0)
Platelets: 265 10*3/uL (ref 150–400)
RBC: 4.13 MIL/uL (ref 3.87–5.11)
RDW: 12.6 % (ref 11.5–15.5)
WBC: 6.1 10*3/uL (ref 4.0–10.5)
nRBC: 0 % (ref 0.0–0.2)

## 2023-07-10 LAB — URINALYSIS, COMPLETE (UACMP) WITH MICROSCOPIC
Bacteria, UA: NONE SEEN
Bilirubin Urine: NEGATIVE
Glucose, UA: NEGATIVE mg/dL
Hgb urine dipstick: NEGATIVE
Ketones, ur: NEGATIVE mg/dL
Nitrite: NEGATIVE
Protein, ur: NEGATIVE mg/dL
Specific Gravity, Urine: 1.006 (ref 1.005–1.030)
pH: 8 (ref 5.0–8.0)

## 2023-07-10 LAB — BASIC METABOLIC PANEL
Anion gap: 10 (ref 5–15)
BUN: 20 mg/dL (ref 8–23)
CO2: 27 mmol/L (ref 22–32)
Calcium: 9.3 mg/dL (ref 8.9–10.3)
Chloride: 103 mmol/L (ref 98–111)
Creatinine, Ser: 0.88 mg/dL (ref 0.44–1.00)
GFR, Estimated: >60 mL/min (ref >60)
Glucose, Bld: 143 mg/dL — ABNORMAL HIGH (ref 70–99)
Potassium: 4.8 mmol/L (ref 3.5–5.1)
Sodium: 140 mmol/L (ref 135–145)

## 2023-07-10 NOTE — ED Triage Notes (Signed)
Pt sts that she was at home and fell. Pt sts that she did not have any LOC, she was just falling. Pt has a skin tear to the left forearm and the right elbow. Bleeding controlled at this time.

## 2023-07-10 NOTE — Discharge Instructions (Signed)
Please keep the bandages in place for the next 48 hours.  After which he may remove and cover with Neosporin and a bandage.  Please follow-up with your doctor for recheck/reevaluation.  Return to the emergency department for any symptom concerning to yourself.

## 2023-07-10 NOTE — ED Provider Notes (Signed)
[facility] Provider Note    Event Date/Time   First MD Initiated Contact with Patient 07/10/23 1604     (approximate)  History   Chief Complaint: Fall  HPI  TRUDI FROME is a 89 y.o. female with a past medical history with right, hypertension, presents to the emergency department after a fall.  According to the patient she was in the kitchen when she fell.  Patient does not recall if she tripped or if she got lightheaded or dizzy.  Daughter states she thinks she was a little unstable earlier today.  Patient denies any recent illnesses denies any fever cough congestion chest pain abdominal pain vomiting diarrhea.  Patient is complaining of pain to her posterior left chest worse with movement.  Has a small skin tear to left forearm and right elbow.  Physical Exam   Triage Vital Signs: ED Triage Vitals  Encounter Vitals Group     BP 07/10/23 1527 (!) 151/72     Systolic BP Percentile --      Diastolic BP Percentile --      Pulse Rate 07/10/23 1527 74     Resp 07/10/23 1527 17     Temp 07/10/23 1527 97.9 F (36.6 C)     Temp Source 07/10/23 1527 Oral     SpO2 07/10/23 1527 97 %     Weight 07/10/23 1528 139 lb (63 kg)     Height 07/10/23 1528 5\' 1"  (1.549 m)     Head Circumference --      Peak Flow --      Pain Score 07/10/23 1528 5     Pain Loc --      Pain Education --      Exclude from Growth Chart --     Most recent vital signs: Vitals:   07/10/23 1527  BP: (!) 151/72  Pulse: 74  Resp: 17  Temp: 97.9 F (36.6 C)  SpO2: 97%    General: Awake, no distress.  CV:  Good peripheral perfusion.  Regular rate and rhythm  Resp:  Normal effort.  Equal breath sounds bilaterally.  Abd:  No distention.  Soft, nontender.  No rebound or guarding. Other:  Approximate 2 cm skin tear to left forearm hemostatic, 1.5 cm skin tear to the right elbow, hemostatic.  Great range of motion in all extremities including bilateral upper and lower extremities  without pain elicited.  Patient has been ambulatory since fall without issue.  ED Results / Procedures / Treatments   EKG  EKG viewed and interpreted by myself shows a sinus rhythm at 64 bpm the narrow QRS, normal axis, normal intervals, no concerning ST changes.  RADIOLOGY  I reviewed and interpreted the CT chest images no pneumothorax or obvious rib injury seen on my evaluation. Radiology has read the x-ray is negative for bony abnormality no cardiopulmonary disease. CT scan of the head and neck are negative   MEDICATIONS ORDERED IN ED: Medications - No data to display   IMPRESSION / MDM / ASSESSMENT AND PLAN / ED COURSE  I reviewed the triage vital signs and the nursing notes.  Patient's presentation is most consistent with acute presentation with potential threat to life or bodily function.  Patient presents emergency department after a fall.  Unclear if this was a mechanical fall versus dizziness.  Patient denies any LOC.  Denies head injury.  No anticoagulation.  Patient does have a approximate 2 cm skin tear to left forearm and a 1.5  cm skin tear to the right elbow.  Given unclear etiology of the fall we will check basic labs including a urinalysis.  Will obtain CT imaging of the head C-spine as well as chest to evaluate for possible injury or rib fracture.  We will continue to closely monitor while awaiting results.  Patient agreeable to plan of care.  Overall states she feels well currently.  Reassuring physical exam reassuring vital signs.  Patient's lab work is reassuring normal CBC, reassuring chemistry including renal function and electrolytes.  Urinalysis is in process.  CT scan of the head and C-spine are negative.  Chest x-ray appears clear with no obvious rib fracture no pneumothorax.  Skin tears have been cleaned and bandaged.  Urinalysis shows no concerning findings either.  Given the patient's reassuring workup I believe the patient is safe for discharge home.   Discussed scheduled Tylenol for pain.  FINAL CLINICAL IMPRESSION(S) / ED DIAGNOSES   Fall Skin tear   Note:  This document was prepared using Dragon voice recognition software and may include unintentional dictation errors.   Minna Antis, MD 07/10/23 [id number]

## 2023-07-15 DIAGNOSIS — R0781 Pleurodynia: Secondary | ICD-10-CM | POA: Diagnosis not present

## 2023-07-15 DIAGNOSIS — R29818 Other symptoms and signs involving the nervous system: Secondary | ICD-10-CM | POA: Diagnosis not present

## 2023-07-15 DIAGNOSIS — S50812A Abrasion of left forearm, initial encounter: Secondary | ICD-10-CM | POA: Diagnosis not present

## 2023-07-15 DIAGNOSIS — W010XXA Fall on same level from slipping, tripping and stumbling without subsequent striking against object, initial encounter: Secondary | ICD-10-CM | POA: Diagnosis not present

## 2023-07-22 DIAGNOSIS — I491 Atrial premature depolarization: Secondary | ICD-10-CM | POA: Diagnosis not present

## 2023-07-22 DIAGNOSIS — E782 Mixed hyperlipidemia: Secondary | ICD-10-CM | POA: Diagnosis not present

## 2023-07-22 DIAGNOSIS — R7303 Prediabetes: Secondary | ICD-10-CM | POA: Diagnosis not present

## 2023-07-22 DIAGNOSIS — R42 Dizziness and giddiness: Secondary | ICD-10-CM | POA: Diagnosis not present

## 2023-07-22 DIAGNOSIS — I10 Essential (primary) hypertension: Secondary | ICD-10-CM | POA: Diagnosis not present

## 2023-08-13 ENCOUNTER — Other Ambulatory Visit: Payer: Self-pay

## 2023-08-13 ENCOUNTER — Ambulatory Visit: Payer: HMO | Attending: Physician Assistant | Admitting: Physical Therapy

## 2023-08-13 DIAGNOSIS — R279 Unspecified lack of coordination: Secondary | ICD-10-CM | POA: Insufficient documentation

## 2023-08-13 DIAGNOSIS — R2681 Unsteadiness on feet: Secondary | ICD-10-CM | POA: Insufficient documentation

## 2023-08-13 DIAGNOSIS — R269 Unspecified abnormalities of gait and mobility: Secondary | ICD-10-CM | POA: Diagnosis not present

## 2023-08-13 NOTE — Therapy (Unsigned)
OUTPATIENT PHYSICAL THERAPY NEURO EVALUATION   Patient Name: Debra Gutierrez MRN: [id number] DOB:03-25-1930, 89 y.o., female Today's Date: 08/13/2023   PCP: Patrice Paradise, MD  REFERRING PROVIDER: Patrice Paradise, MD   END OF SESSION:  PT End of Session - 08/13/23 1104     Visit Number 1    Number of Visits 8    Date for PT Re-Evaluation 10/08/23    PT Start Time 1104    PT Stop Time 1145    PT Time Calculation (min) 41 min    Equipment Utilized During Treatment Gait belt    Activity Tolerance Patient tolerated treatment well    Behavior During Therapy WFL for tasks assessed/performed             Past Medical History:  Diagnosis Date   Arthritis    Hypertension    Irregular heart beat    Past Surgical History:  Procedure Laterality Date   ABDOMINAL HYSTERECTOMY     BREAST LUMPECTOMY     COLONOSCOPY N/A 09/03/2021   Procedure: COLONOSCOPY;  Surgeon: Jaynie Collins, DO;  Location: [facility] ENDOSCOPY;  Service: Gastroenterology;  Laterality: N/A;   ENDOSCOPIC RETROGRADE CHOLANGIOPANCREATOGRAPHY (ERCP) WITH PROPOFOL N/A 04/25/2022   Procedure: ENDOSCOPIC RETROGRADE CHOLANGIOPANCREATOGRAPHY (ERCP) WITH PROPOFOL;  Surgeon: Midge Minium, MD;  Location: [facility] ENDOSCOPY;  Service: Endoscopy;  Laterality: N/A;   KNEE SURGERY     thumb surgery     UMBILICAL HERNIA REPAIR  04/30/2022   Procedure: HERNIA REPAIR UMBILICAL ADULT;  Surgeon: Leafy Ro, MD;  Location: [facility] ORS;  Service: General;;   WRIST SURGERY     Patient Active Problem List   Diagnosis Date Noted   Prediabetes 04/27/2022   Acute cholecystitis 04/26/2022   Post-ERCP acute pancreatitis 04/26/2022   Cholelithiasis with choledocholithiasis 04/25/2022   Hypertension    Premature atrial complex 10/10/2021   Osteoporosis 03/08/2015   History of total knee arthroplasty, left 05/19/2014   Trigeminal neuralgia of left side of face 09/08/2013    ONSET DATE: 07/10/2023  REFERRING DIAG:   W09.XXXS (ICD-10-CM) - Unspecified fall, sequela  R29.818 (ICD-10-CM) - Other symptoms and signs involving the nervous system    THERAPY DIAG:  Abnormality of gait  Unspecified lack of coordination  Unsteadiness on feet  Rationale for Evaluation and Treatment: Rehabilitation  SUBJECTIVE:                                                                                                                                                                                             SUBJECTIVE STATEMENT: Pt reports falling home in walking from living room to kitchen  and fell beside the dining room table. Hit R and L UE and L side in fall. Pt reports calling family to come assist with recory form floor.   Pt accompanied by: self  PERTINENT HISTORY:  From recent MD Visit. "89 yo female with history of hypertension, trigeminal neuralgia. Says she was walking in hallway, felt "staggery" and fell forward. No loss of consciousness. She was unable to get up on her own.Had "wind knocked out of her". Went to ER and had xrays, no fractures, neg head CT scan. Still some posterior chest wall pain. Has cane she uses in the morning. Family calls in daily. "  PAIN:  Are you having pain? No  PRECAUTIONS: None  RED FLAGS: Bowel or bladder incontinence: Yes: occasional leakage     WEIGHT BEARING RESTRICTIONS: No  FALLS: Has patient fallen in last 6 months? Yes. Number of falls 1  LIVING ENVIRONMENT: Lives with: lives alone Lives in: House/apartment Stairs: Yes: Internal: 2-3 steps; none and External: 1 or 4  steps; on right going up Has following equipment at home: Single point cane, Walker - 2 wheeled, and does not use frequently    PLOF: Independent, Independent with basic ADLs, and Leisure: sewing    PATIENT GOALS: not sure at start of Evaluation   OBJECTIVE:   DIAGNOSTIC FINDINGS:    CT chest images no pneumothorax or obvious rib injury seen Radiology has read the x-ray is negative for bony  abnormality no cardiopulmonary disease. CT scan of the head and neck are negative  COGNITION: Overall cognitive status: Within functional limits for tasks assessed   SENSATION: Light touch: WFL and reports decreased sensitivity proximal in BLE  Recent increased sensitivtiy at night   COORDINATION: Decreased ROM in the LLE. Coordination WFL bil   EDEMA:  N/a   MUSCLE TONE: N/A    POSTURE: rounded shoulders  LOWER EXTREMITY MMT:      MMT Right Eval Left Eval  Hip flexion 4+ 4  Hip extension    Hip abduction 4+ 4+  Hip adduction 5 5  Hip internal rotation    Hip external rotation    Knee flexion 4+ 4+  Knee extension 5 5  Ankle dorsiflexion 4+ 4  Ankle plantarflexion    Ankle inversion    Ankle eversion     (Blank rows = not tested)   BED MOBILITY:  Indep   TRANSFERS: Assistive device utilized: {Assistive devices:23999}  Sit to stand: {Levels of assistance:24026} Stand to sit: {Levels of assistance:24026} Chair to chair: {Levels of assistance:24026} Floor: {Levels of assistance:24026}  RAMP:  Level of Assistance: {Levels of assistance:24026} Assistive device utilized: {Assistive devices:23999} Ramp Comments: ***  CURB:  Level of Assistance: {Levels of assistance:24026} Assistive device utilized: {Assistive devices:23999} Curb Comments: ***  STAIRS: Level of Assistance: {Levels of assistance:24026} Stair Negotiation Technique: {Stair Technique:27161} with {Rail Assistance:27162} Number of Stairs: ***  Height of Stairs: ***  Comments: ***  GAIT: Gait pattern: {gait characteristics:25376} Distance walked: *** Assistive device utilized: {Assistive devices:23999} Level of assistance: {Levels of assistance:24026} Comments: ***  FUNCTIONAL TESTS:  5 times sit to stand: 16.45 Timed up and go (TUG): 11.72 6 minute walk test: *** 10 meter walk test: 0.65m/s Berg Balance Scale: 44 Dynamic Gait Index: 16  PATIENT SURVEYS:  FOTO To be completed    TODAY'S TREATMENT:  DATE: ***    PATIENT EDUCATION: Education details: *** Person educated: {Person educated:25204} Education method: {Education Method:25205} Education comprehension: {Education Comprehension:25206}  HOME EXERCISE PROGRAM: ***  GOALS: Goals reviewed with patient? Yes   SHORT TERM GOALS: Target date: 09/10/2023    Patient will be independent in home exercise program to improve strength/mobility for better functional independence with ADLs. Baseline: Goal status: INITIAL   LONG TERM GOALS: Target date:10/08/2023    Patient will increase FOTO score to equal to or greater than     to demonstrate statistically significant improvement in mobility and quality of life.  Baseline: to be completed on next session  Goal status: INITIAL  2.  Patient (> 101 years old) will complete five times sit to stand test in < 15 seconds indicating an increased LE strength and improved balance. Baseline: 16.45 Goal status: INITIAL  3.  Patient will increase Berg Balance score by > 6 points to demonstrate decreased fall risk during functional activities Baseline: 44 Goal status: INITIAL  4.  Patient will increase 10 meter walk test to >1.70m/s as to improve gait speed for better community ambulation and to reduce fall risk. Baseline:  Goal status: {GOALSTATUS:25110}  5.  Patient will reduce timed up and go to <11 seconds to reduce fall risk and demonstrate improved transfer/gait ability. Baseline:  Goal status: {GOALSTATUS:25110}  6.  Patient will increase dynamic gait index score to >19/24 as to demonstrate reduced fall risk and improved dynamic gait balance for better safety with community/home ambulation.   Baseline:  Goal status: {GOALSTATUS:25110}   ASSESSMENT:  CLINICAL IMPRESSION: Patient is a *** y.o. *** who was seen today for physical  therapy evaluation and treatment for ***.   OBJECTIVE IMPAIRMENTS: {opptimpairments:25111}.   ACTIVITY LIMITATIONS: {activitylimitations:27494}  PARTICIPATION LIMITATIONS: {participationrestrictions:25113}  PERSONAL FACTORS: {Personal factors:25162} are also affecting patient's functional outcome.   REHAB POTENTIAL: {rehabpotential:25112}  CLINICAL DECISION MAKING: {clinical decision making:25114}  EVALUATION COMPLEXITY: {Evaluation complexity:25115}  PLAN:  PT FREQUENCY: {rehab frequency:25116}  PT DURATION: {rehab duration:25117}  PLANNED INTERVENTIONS: Therapeutic exercises, Therapeutic activity, Neuromuscular re-education, Balance training, Gait training, Patient/Family education, Self Care, Joint mobilization, Stair training, Vestibular training, Cryotherapy, Moist heat, Manual therapy, and Re-evaluation  PLAN FOR NEXT SESSION:   Complete Sela Hua, PT 08/13/2023, 12:23 PM

## 2023-08-15 ENCOUNTER — Ambulatory Visit: Payer: HMO

## 2023-08-15 DIAGNOSIS — R2681 Unsteadiness on feet: Secondary | ICD-10-CM

## 2023-08-15 DIAGNOSIS — R269 Unspecified abnormalities of gait and mobility: Secondary | ICD-10-CM | POA: Diagnosis not present

## 2023-08-15 DIAGNOSIS — R279 Unspecified lack of coordination: Secondary | ICD-10-CM

## 2023-08-15 NOTE — Therapy (Signed)
OUTPATIENT PHYSICAL THERAPY NEURO EVALUATION   Patient Name: Debra Gutierrez MRN: [id number] DOB:February 26, 1930, 89 y.o., female Today's Date: 08/15/2023   PCP: Patrice Paradise, MD  REFERRING PROVIDER: Patrice Paradise, MD   END OF SESSION:  PT End of Session - 08/15/23 0926     Visit Number 2    Number of Visits 8    Date for PT Re-Evaluation 10/08/23    PT Start Time 0930    PT Stop Time 1014    PT Time Calculation (min) 44 min    Equipment Utilized During Treatment Gait belt    Activity Tolerance Patient tolerated treatment well    Behavior During Therapy WFL for tasks assessed/performed             Past Medical History:  Diagnosis Date   Arthritis    Hypertension    Irregular heart beat    Past Surgical History:  Procedure Laterality Date   ABDOMINAL HYSTERECTOMY     BREAST LUMPECTOMY     COLONOSCOPY N/A 09/03/2021   Procedure: COLONOSCOPY;  Surgeon: Jaynie Collins, DO;  Location: [facility] ENDOSCOPY;  Service: Gastroenterology;  Laterality: N/A;   ENDOSCOPIC RETROGRADE CHOLANGIOPANCREATOGRAPHY (ERCP) WITH PROPOFOL N/A 04/25/2022   Procedure: ENDOSCOPIC RETROGRADE CHOLANGIOPANCREATOGRAPHY (ERCP) WITH PROPOFOL;  Surgeon: Midge Minium, MD;  Location: [facility] ENDOSCOPY;  Service: Endoscopy;  Laterality: N/A;   KNEE SURGERY     thumb surgery     UMBILICAL HERNIA REPAIR  04/30/2022   Procedure: HERNIA REPAIR UMBILICAL ADULT;  Surgeon: Leafy Ro, MD;  Location: [facility] ORS;  Service: General;;   WRIST SURGERY     Patient Active Problem List   Diagnosis Date Noted   Prediabetes 04/27/2022   Acute cholecystitis 04/26/2022   Post-ERCP acute pancreatitis 04/26/2022   Cholelithiasis with choledocholithiasis 04/25/2022   Hypertension    Premature atrial complex 10/10/2021   Osteoporosis 03/08/2015   History of total knee arthroplasty, left 05/19/2014   Trigeminal neuralgia of left side of face 09/08/2013    ONSET DATE: 07/10/2023  REFERRING DIAG:   O13.XXXS (ICD-10-CM) - Unspecified fall, sequela  R29.818 (ICD-10-CM) - Other symptoms and signs involving the nervous system    THERAPY DIAG:  Abnormality of gait  Unspecified lack of coordination  Unsteadiness on feet  Rationale for Evaluation and Treatment: Rehabilitation  SUBJECTIVE:                                                                                                                                                                                             SUBJECTIVE STATEMENT: Pt reports falling home in walking from living room to kitchen  and fell beside the dining room table. Hit R and L UE and L side in fall. Pt reports calling family to come assist with recory form floor.   Pt accompanied by: self  PERTINENT HISTORY:  From recent MD Visit. "89 yo female with history of hypertension, trigeminal neuralgia. Says she was walking in hallway, felt "staggery" and fell forward. No loss of consciousness. She was unable to get up on her own.Had "wind knocked out of her". Went to ER and had xrays, no fractures, neg head CT scan. Still some posterior chest wall pain. Has cane she uses in the morning. Family calls in daily. "  PAIN:  Are you having pain? No  PRECAUTIONS: None  RED FLAGS: Bowel or bladder incontinence: Yes: occasional leakage     WEIGHT BEARING RESTRICTIONS: No  FALLS: Has patient fallen in last 6 months? Yes. Number of falls 1  LIVING ENVIRONMENT: Lives with: lives alone Lives in: House/apartment Stairs: Yes: Internal: 2-3 steps; none and External: 1 or 4  steps; on right going up Has following equipment at home: Single point cane, Walker - 2 wheeled, and does not use frequently    PLOF: Independent, Independent with basic ADLs, and Leisure: sewing    PATIENT GOALS: not sure at start of Evaluation   OBJECTIVE:   DIAGNOSTIC FINDINGS:    CT chest images no pneumothorax or obvious rib injury seen Radiology has read the x-ray is negative for bony  abnormality no cardiopulmonary disease. CT scan of the head and neck are negative  COGNITION: Overall cognitive status: Within functional limits for tasks assessed   SENSATION: Light touch: WFL and reports decreased sensitivity proximal in BLE  Recent increased sensitivtiy at night   COORDINATION: Decreased ROM in the LLE. Coordination WFL bil   EDEMA:  N/a   MUSCLE TONE: N/A    POSTURE: rounded shoulders  LOWER EXTREMITY MMT:      MMT Right Eval Left Eval  Hip flexion 4+ 4  Hip extension    Hip abduction 4+ 4+  Hip adduction 5 5  Hip internal rotation    Hip external rotation    Knee flexion 4+ 4+  Knee extension 5 5  Ankle dorsiflexion 4+ 4  Ankle plantarflexion    Ankle inversion    Ankle eversion     (Blank rows = not tested)   BED MOBILITY:  Indep   TRANSFERS: Assistive device utilized:  none   Sit to stand: Complete Independence Stand to sit: Complete Independence Chair to chair: Complete Independence Floor:  to be assessed     CURB:  Level of Assistance: CGA Assistive device utilized: None Curb Comments: Required intermittent UE support   STAIRS: Level of Assistance: Modified independence Stair Negotiation Technique: Alternating Pattern  with Single Rail on Right Number of Stairs: 8  Height of Stairs: 6  Comments: mild staggering with   GAIT: Gait pattern: scissoring, lateral hip instability, and narrow BOS Distance walked: 38ft Assistive device utilized: None Level of assistance: CGA Comments: mild staggering with adduction intermittently with BLE, pt reports this has been her gait pattern her whole life.   FUNCTIONAL TESTS:  5 times sit to stand: 16.45 Timed up and go (TUG): 11.72 6 minute walk test: TBD 10 meter walk test: 0.72m/s Berg Balance Scale: 44 Dynamic Gait Index: 16  PATIENT SURVEYS:  FOTO To be completed   TODAY'S TREATMENT:  DATE: 08/15/23     BP sitting= 156/78 BP= standing= 127/74- aysmptomatic     FOTO= 55 with goal of 57   6 Min Walk Test:  Instructed patient to ambulate as quickly and as safely as possible for 6 minutes using LRAD. Patient was allowed to take standing rest breaks without stopping the test, but if the patient required a sitting rest break the clock would be stopped and the test would be over.  Results: 525 feet (160 meters) using a small based QC with CGA. Results indicate that the patient has reduced endurance with ambulation compared to age matched norms.  Age Matched Norms: 32-69 yo M: 26 F: 50, 47-79 yo M: 50 F: 471, 7-89 yo M: 417 F: 392 MDC: 58.21 meters (190.98 feet) or 50 meters (ANPTA Core Set of Outcome Measures for Adults with Neurologic Conditions, 2018)   Therex:   Standing Calf raises 2 sets x 10 reps Standing toe raises 2 sets x 10 reps Standing hip abd x 15 reps each LE Sit to stand with arms crossed x 10 reps    PATIENT EDUCATION: Education details: purpose of functional outcomes Person educated: Patient Education method: Explanation Education comprehension: verbalized understanding  HOME EXERCISE PROGRAM: Access Code: [email or web address] URL: [email or web address] Date: 08/15/2023 Prepared by: Maureen Ralphs  Exercises - Sit to Stand with Arms Crossed  - 1 x daily - 3 x weekly - 3 sets - 10 reps - Standing Hip Abduction with Counter Support  - 1 x daily - 3 x weekly - 3 sets - 10 reps - Standing Heel Raises  - 1 x daily - 3 x weekly - 3 sets - 10 reps  GOALS: Goals reviewed with patient? Yes   SHORT TERM GOALS: Target date: 09/10/2023    Patient will be independent in home exercise program to improve strength/mobility for better functional independence with ADLs. Baseline: Goal status: INITIAL   LONG TERM GOALS: Target date:10/08/2023    Patient will increase FOTO score to equal to or greater  than 57 to demonstrate statistically significant improvement in mobility and quality of life.  Baseline: 08/15/2023= 55 Goal status: INITIAL  2.  Patient (> 4 years old) will complete five times sit to stand test in < 15 seconds indicating an increased LE strength and improved balance. Baseline: 16.45 Goal status: INITIAL  3.  Patient will increase Berg Balance score by > 6 points to demonstrate decreased fall risk during functional activities Baseline: 44 Goal status: INITIAL  4.  Patient will increase 10 meter walk test to >1.50m/s as to improve gait speed for better community ambulation and to reduce fall risk. Baseline: 0.52m/s Goal status: INITIAL  5.  Patient will reduce timed up and go to <11 seconds to reduce fall risk and demonstrate improved transfer/gait ability. Baseline: 11.27 Goal status: INITIAL  6.  Patient will increase dynamic gait index score to >19/24 as to demonstrate reduced fall risk and improved dynamic gait balance for better safety with community/home ambulation.   Baseline: 16 Goal status: INITIAL 6.  Patient will increase 6 min walk test by at least 169ft  as to demonstrate reduced fall risk and improved dynamic gait balance for better safety with community/home ambulation.   Baseline: 08/15/2023= 525 feet with SBQC Goal status: INITIAL   ASSESSMENT:  CLINICAL IMPRESSION: Patient presents with good effort for her 2nd visit. Increased time spent discussing how PT could help her balance as well as importance of LE strengthening and compliance to any HEP for  max benefit. Patient presents as skeptical but agreeable to try. She does present with gait deficits and decreased distance with 6 min walk test (compared to closest age group tested). She responded favorably to instruction in some basic LE strengthening and denied any pain. Patient will benefit from skilled PT services to improve her overall functional LE strength and improve functional mobility for  improved quality of life and decreased risk of falling.   OBJECTIVE IMPAIRMENTS: Abnormal gait, cardiopulmonary status limiting activity, decreased activity tolerance, decreased balance, decreased coordination, decreased endurance, decreased knowledge of condition, decreased knowledge of use of DME, difficulty walking, decreased ROM, decreased strength, decreased safety awareness, and improper body mechanics.   ACTIVITY LIMITATIONS: carrying, sitting, squatting, sleeping, stairs, transfers, and locomotion level  PARTICIPATION LIMITATIONS: laundry, shopping, community activity, and occupation  PERSONAL FACTORS: Age, Behavior pattern, and Education are also affecting patient's functional outcome.   REHAB POTENTIAL: Good  CLINICAL DECISION MAKING: Stable/uncomplicated  EVALUATION COMPLEXITY: Moderate  PLAN:  PT FREQUENCY: 1x/week  PT DURATION: 8 weeks  PLANNED INTERVENTIONS: Therapeutic exercises, Therapeutic activity, Neuromuscular re-education, Balance training, Gait training, Patient/Family education, Self Care, Joint mobilization, Stair training, Vestibular training, Cryotherapy, Moist heat, Manual therapy, and Re-evaluation  PLAN FOR NEXT SESSION:   Initiate balance training next visit and progress LE strengthening    Lenda Kelp, PT 08/15/2023, 11:26 AM

## 2023-08-21 ENCOUNTER — Ambulatory Visit: Payer: HMO

## 2023-08-22 ENCOUNTER — Ambulatory Visit: Payer: HMO

## 2023-08-22 DIAGNOSIS — R269 Unspecified abnormalities of gait and mobility: Secondary | ICD-10-CM

## 2023-08-22 DIAGNOSIS — R2681 Unsteadiness on feet: Secondary | ICD-10-CM

## 2023-08-22 DIAGNOSIS — R279 Unspecified lack of coordination: Secondary | ICD-10-CM

## 2023-08-22 NOTE — Therapy (Signed)
OUTPATIENT PHYSICAL THERAPY NEURO TREATMENT   Patient Name: Debra Gutierrez MRN: [id number] DOB:1930-02-11, 89 y.o., female Today's Date: 08/22/2023   PCP: Patrice Paradise, MD  REFERRING PROVIDER: Patrice Paradise, MD   END OF SESSION:  PT End of Session - 08/22/23 0936     Visit Number 3    Number of Visits 8    Date for PT Re-Evaluation 10/08/23    PT Start Time 0932    PT Stop Time 1010    PT Time Calculation (min) 38 min    Equipment Utilized During Treatment Gait belt    Activity Tolerance Patient tolerated treatment well    Behavior During Therapy WFL for tasks assessed/performed              Past Medical History:  Diagnosis Date   Arthritis    Hypertension    Irregular heart beat    Past Surgical History:  Procedure Laterality Date   ABDOMINAL HYSTERECTOMY     BREAST LUMPECTOMY     COLONOSCOPY N/A 09/03/2021   Procedure: COLONOSCOPY;  Surgeon: Jaynie Collins, DO;  Location: [facility] ENDOSCOPY;  Service: Gastroenterology;  Laterality: N/A;   ENDOSCOPIC RETROGRADE CHOLANGIOPANCREATOGRAPHY (ERCP) WITH PROPOFOL N/A 04/25/2022   Procedure: ENDOSCOPIC RETROGRADE CHOLANGIOPANCREATOGRAPHY (ERCP) WITH PROPOFOL;  Surgeon: Midge Minium, MD;  Location: [facility] ENDOSCOPY;  Service: Endoscopy;  Laterality: N/A;   KNEE SURGERY     thumb surgery     UMBILICAL HERNIA REPAIR  04/30/2022   Procedure: HERNIA REPAIR UMBILICAL ADULT;  Surgeon: Leafy Ro, MD;  Location: [facility] ORS;  Service: General;;   WRIST SURGERY     Patient Active Problem List   Diagnosis Date Noted   Prediabetes 04/27/2022   Acute cholecystitis 04/26/2022   Post-ERCP acute pancreatitis 04/26/2022   Cholelithiasis with choledocholithiasis 04/25/2022   Hypertension    Premature atrial complex 10/10/2021   Osteoporosis 03/08/2015   History of total knee arthroplasty, left 05/19/2014   Trigeminal neuralgia of left side of face 09/08/2013    ONSET DATE: 07/10/2023  REFERRING DIAG:   E95.XXXS (ICD-10-CM) - Unspecified fall, sequela  R29.818 (ICD-10-CM) - Other symptoms and signs involving the nervous system    THERAPY DIAG:  Abnormality of gait  Unspecified lack of coordination  Unsteadiness on feet  Rationale for Evaluation and Treatment: Rehabilitation  SUBJECTIVE:                                                                                                                                                                                             SUBJECTIVE STATEMENT: I am worried about my knee and doing Physical Therapy.  Every time I come it hurts my knee.   Pt accompanied by: self  PERTINENT HISTORY:  From recent MD Visit. "89 yo female with history of hypertension, trigeminal neuralgia. Says she was walking in hallway, felt "staggery" and fell forward. No loss of consciousness. She was unable to get up on her own.Had "wind knocked out of her". Went to ER and had xrays, no fractures, neg head CT scan. Still some posterior chest wall pain. Has cane she uses in the morning. Family calls in daily. "  PAIN:  Are you having pain? No  PRECAUTIONS: None  RED FLAGS: Bowel or bladder incontinence: Yes: occasional leakage     WEIGHT BEARING RESTRICTIONS: No  FALLS: Has patient fallen in last 6 months? Yes. Number of falls 1  LIVING ENVIRONMENT: Lives with: lives alone Lives in: House/apartment Stairs: Yes: Internal: 2-3 steps; none and External: 1 or 4  steps; on right going up Has following equipment at home: Single point cane, Walker - 2 wheeled, and does not use frequently    PLOF: Independent, Independent with basic ADLs, and Leisure: sewing    PATIENT GOALS: not sure at start of Evaluation   OBJECTIVE:   DIAGNOSTIC FINDINGS:    CT chest images no pneumothorax or obvious rib injury seen Radiology has read the x-ray is negative for bony abnormality no cardiopulmonary disease. CT scan of the head and neck are negative  COGNITION: Overall  cognitive status: Within functional limits for tasks assessed   SENSATION: Light touch: WFL and reports decreased sensitivity proximal in BLE  Recent increased sensitivtiy at night   COORDINATION: Decreased ROM in the LLE. Coordination WFL bil   EDEMA:  N/a   MUSCLE TONE: N/A    POSTURE: rounded shoulders  LOWER EXTREMITY MMT:      MMT Right Eval Left Eval  Hip flexion 4+ 4  Hip extension    Hip abduction 4+ 4+  Hip adduction 5 5  Hip internal rotation    Hip external rotation    Knee flexion 4+ 4+  Knee extension 5 5  Ankle dorsiflexion 4+ 4  Ankle plantarflexion    Ankle inversion    Ankle eversion     (Blank rows = not tested)   BED MOBILITY:  Indep   TRANSFERS: Assistive device utilized:  none   Sit to stand: Complete Independence Stand to sit: Complete Independence Chair to chair: Complete Independence Floor:  to be assessed     CURB:  Level of Assistance: CGA Assistive device utilized: None Curb Comments: Required intermittent UE support   STAIRS: Level of Assistance: Modified independence Stair Negotiation Technique: Alternating Pattern  with Single Rail on Right Number of Stairs: 8  Height of Stairs: 6  Comments: mild staggering with   GAIT: Gait pattern: scissoring, lateral hip instability, and narrow BOS Distance walked: 109ft Assistive device utilized: None Level of assistance: CGA Comments: mild staggering with adduction intermittently with BLE, pt reports this has been her gait pattern her whole life.   FUNCTIONAL TESTS:  5 times sit to stand: 16.45 Timed up and go (TUG): 11.72 6 minute walk test: TBD 10 meter walk test: 0.78m/s Berg Balance Scale: 44 Dynamic Gait Index: 16  PATIENT SURVEYS:  FOTO To be completed   TODAY'S TREATMENT:  DATE: 08/22/23     Discussion with patient regarding benefits of  PT with treating conditions including Osteoarthrits and benefit of resistive strengthening for bone density, pain relief, muscle strength and physical function. Instructed patient than treatment would modify to be as pain free as possible.    Therex:  Modified to sitting secondary to patient complaint of right knee pain after last 2 sessions.  2 sets each LE- 10 rep on 1st and 5 reps on 2nd:  Seated hip march  Seated Calf raises  Seated  toe raises  Seated hip abd  Seated hip add with ball squeeze Seated ham curl  PATIENT EDUCATION: Education details: purpose of functional outcomes Person educated: Patient Education method: Explanation Education comprehension: verbalized understanding  HOME EXERCISE PROGRAM: Access Code: [email or web address] URL: [email or web address] Date: 08/15/2023 Prepared by: Maureen Ralphs  Exercises - Sit to Stand with Arms Crossed  - 1 x daily - 3 x weekly - 3 sets - 10 reps - Standing Hip Abduction with Counter Support  - 1 x daily - 3 x weekly - 3 sets - 10 reps - Standing Heel Raises  - 1 x daily - 3 x weekly - 3 sets - 10 reps  GOALS: Goals reviewed with patient? Yes   SHORT TERM GOALS: Target date: 09/10/2023    Patient will be independent in home exercise program to improve strength/mobility for better functional independence with ADLs. Baseline: Goal status: INITIAL   LONG TERM GOALS: Target date:10/08/2023    Patient will increase FOTO score to equal to or greater than 57 to demonstrate statistically significant improvement in mobility and quality of life.  Baseline: 08/15/2023= 55 Goal status: INITIAL  2.  Patient (> 27 years old) will complete five times sit to stand test in < 15 seconds indicating an increased LE strength and improved balance. Baseline: 16.45 Goal status: INITIAL  3.  Patient will increase Berg Balance score by > 6 points to demonstrate decreased fall risk during functional activities Baseline: 44 Goal  status: INITIAL  4.  Patient will increase 10 meter walk test to >1.55m/s as to improve gait speed for better community ambulation and to reduce fall risk. Baseline: 0.9m/s Goal status: INITIAL  5.  Patient will reduce timed up and go to <11 seconds to reduce fall risk and demonstrate improved transfer/gait ability. Baseline: 11.27 Goal status: INITIAL  6.  Patient will increase dynamic gait index score to >19/24 as to demonstrate reduced fall risk and improved dynamic gait balance for better safety with community/home ambulation.   Baseline: 16 Goal status: INITIAL 6.  Patient will increase 6 min walk test by at least 149ft  as to demonstrate reduced fall risk and improved dynamic gait balance for better safety with community/home ambulation.   Baseline: 08/15/2023= 525 feet with SBQC Goal status: INITIAL   ASSESSMENT:  CLINICAL IMPRESSION: Treatment focused on educating patient of potential benefit of PT to treat her painful knee and need to modify an exercise as needed to avoid excessive pain. She responded better to instruction in seated LE strengthening and denied any increased right knee pain today. Patient will benefit from skilled PT services to improve her overall functional LE strength and improve functional mobility for improved quality of life and decreased risk of falling.   OBJECTIVE IMPAIRMENTS: Abnormal gait, cardiopulmonary status limiting activity, decreased activity tolerance, decreased balance, decreased coordination, decreased endurance, decreased knowledge of condition, decreased knowledge of use of DME, difficulty walking, decreased ROM, decreased strength, decreased safety awareness, and improper  body mechanics.   ACTIVITY LIMITATIONS: carrying, sitting, squatting, sleeping, stairs, transfers, and locomotion level  PARTICIPATION LIMITATIONS: laundry, shopping, community activity, and occupation  PERSONAL FACTORS: Age, Behavior pattern, and Education are also  affecting patient's functional outcome.   REHAB POTENTIAL: Good  CLINICAL DECISION MAKING: Stable/uncomplicated  EVALUATION COMPLEXITY: Moderate  PLAN:  PT FREQUENCY: 1x/week  PT DURATION: 8 weeks  PLANNED INTERVENTIONS: Therapeutic exercises, Therapeutic activity, Neuromuscular re-education, Balance training, Gait training, Patient/Family education, Self Care, Joint mobilization, Stair training, Vestibular training, Cryotherapy, Moist heat, Manual therapy, and Re-evaluation  PLAN FOR NEXT SESSION:   Initiate balance training next visit and progress LE strengthening    Lenda Kelp, PT 08/22/2023, 10:10 AM

## 2023-08-26 ENCOUNTER — Ambulatory Visit: Payer: HMO | Admitting: Physical Therapy

## 2023-08-26 DIAGNOSIS — R2681 Unsteadiness on feet: Secondary | ICD-10-CM

## 2023-08-26 DIAGNOSIS — R279 Unspecified lack of coordination: Secondary | ICD-10-CM

## 2023-08-26 DIAGNOSIS — R269 Unspecified abnormalities of gait and mobility: Secondary | ICD-10-CM | POA: Diagnosis not present

## 2023-08-26 NOTE — Therapy (Signed)
OUTPATIENT PHYSICAL THERAPY NEURO TREATMENT   Patient Name: Debra Gutierrez MRN: [id number] DOB:[birth year], 89 y.o., female Today's Date: 08/26/2023   PCP: Patrice Paradise, MD  REFERRING PROVIDER: Patrice Paradise, MD   END OF SESSION:  PT End of Session - 08/26/23 1120     Visit Number 4    Number of Visits 8    Date for PT Re-Evaluation 10/08/23    PT Start Time 1113    PT Stop Time 1145    PT Time Calculation (min) 32 min    Equipment Utilized During Treatment Gait belt    Activity Tolerance Patient tolerated treatment well    Behavior During Therapy WFL for tasks assessed/performed              Past Medical History:  Diagnosis Date   Arthritis    Hypertension    Irregular heart beat    Past Surgical History:  Procedure Laterality Date   ABDOMINAL HYSTERECTOMY     BREAST LUMPECTOMY     COLONOSCOPY N/A 09/03/2021   Procedure: COLONOSCOPY;  Surgeon: Jaynie Collins, DO;  Location: [facility] ENDOSCOPY;  Service: Gastroenterology;  Laterality: N/A;   ENDOSCOPIC RETROGRADE CHOLANGIOPANCREATOGRAPHY (ERCP) WITH PROPOFOL N/A 04/25/2022   Procedure: ENDOSCOPIC RETROGRADE CHOLANGIOPANCREATOGRAPHY (ERCP) WITH PROPOFOL;  Surgeon: Midge Minium, MD;  Location: [facility] ENDOSCOPY;  Service: Endoscopy;  Laterality: N/A;   KNEE SURGERY     thumb surgery     UMBILICAL HERNIA REPAIR  04/30/2022   Procedure: HERNIA REPAIR UMBILICAL ADULT;  Surgeon: Leafy Ro, MD;  Location: [facility] ORS;  Service: General;;   WRIST SURGERY     Patient Active Problem List   Diagnosis Date Noted   Prediabetes 04/27/2022   Acute cholecystitis 04/26/2022   Post-ERCP acute pancreatitis 04/26/2022   Cholelithiasis with choledocholithiasis 04/25/2022   Hypertension    Premature atrial complex 10/10/2021   Osteoporosis 03/08/2015   History of total knee arthroplasty, left 05/19/2014   Trigeminal neuralgia of left side of face 09/08/2013    ONSET DATE: 07/10/2023  REFERRING DIAG:   W09.XXXS (ICD-10-CM) - Unspecified fall, sequela  R29.818 (ICD-10-CM) - Other symptoms and signs involving the nervous system    THERAPY DIAG:  Abnormality of gait  Unspecified lack of coordination  Unsteadiness on feet  Rationale for Evaluation and Treatment: Rehabilitation  SUBJECTIVE:                                                                                                                                                                                             SUBJECTIVE STATEMENT: Pt reports mild knee pain at start of session, but  does not rate, "it just hurts a little." Was able to go to a baby sower over the weekend. No falls or LOB per pt.    Pt accompanied by: self  PERTINENT HISTORY:  From recent MD Visit. "89 yo female with history of hypertension, trigeminal neuralgia. Says she was walking in hallway, felt "staggery" and fell forward. No loss of consciousness. She was unable to get up on her own.Had "wind knocked out of her". Went to ER and had xrays, no fractures, neg head CT scan. Still some posterior chest wall pain. Has cane she uses in the morning. Family calls in daily. "  PAIN:  Are you having pain? No  PRECAUTIONS: None  RED FLAGS: Bowel or bladder incontinence: Yes: occasional leakage     WEIGHT BEARING RESTRICTIONS: No  FALLS: Has patient fallen in last 6 months? Yes. Number of falls 1  LIVING ENVIRONMENT: Lives with: lives alone Lives in: House/apartment Stairs: Yes: Internal: 2-3 steps; none and External: 1 or 4  steps; on right going up Has following equipment at home: Single point cane, Walker - 2 wheeled, and does not use frequently    PLOF: Independent, Independent with basic ADLs, and Leisure: sewing    PATIENT GOALS: not sure at start of Evaluation   OBJECTIVE:   DIAGNOSTIC FINDINGS:    CT chest images no pneumothorax or obvious rib injury seen Radiology has read the x-ray is negative for bony abnormality no cardiopulmonary  disease. CT scan of the head and neck are negative  COGNITION: Overall cognitive status: Within functional limits for tasks assessed   SENSATION: Light touch: WFL and reports decreased sensitivity proximal in BLE  Recent increased sensitivtiy at night   COORDINATION: Decreased ROM in the LLE. Coordination WFL bil   EDEMA:  N/a   MUSCLE TONE: N/A    POSTURE: rounded shoulders  LOWER EXTREMITY MMT:      MMT Right Eval Left Eval  Hip flexion 4+ 4  Hip extension    Hip abduction 4+ 4+  Hip adduction 5 5  Hip internal rotation    Hip external rotation    Knee flexion 4+ 4+  Knee extension 5 5  Ankle dorsiflexion 4+ 4  Ankle plantarflexion    Ankle inversion    Ankle eversion     (Blank rows = not tested)   BED MOBILITY:  Indep   TRANSFERS: Assistive device utilized:  none   Sit to stand: Complete Independence Stand to sit: Complete Independence Chair to chair: Complete Independence Floor:  to be assessed     CURB:  Level of Assistance: CGA Assistive device utilized: None Curb Comments: Required intermittent UE support   STAIRS: Level of Assistance: Modified independence Stair Negotiation Technique: Alternating Pattern  with Single Rail on Right Number of Stairs: 8  Height of Stairs: 6  Comments: mild staggering with   GAIT: Gait pattern: scissoring, lateral hip instability, and narrow BOS Distance walked: 19ft Assistive device utilized: None Level of assistance: CGA Comments: mild staggering with adduction intermittently with BLE, pt reports this has been her gait pattern her whole life.   FUNCTIONAL TESTS:  5 times sit to stand: 16.45 Timed up and go (TUG): 11.72 6 minute walk test: TBD 10 meter walk test: 0.49m/s Berg Balance Scale: 44 Dynamic Gait Index: 16  PATIENT SURVEYS:  FOTO To be completed   TODAY'S TREATMENT:  DATE: 08/26/23    Nustep BLE endurance training x 5.5 min level 1-3 with 1 min cool down.   PT donned ankel weights (3#).  Weighted gait training x 383ft with cues to reduce foot drag.  Side stepping 7ft x 3 bil  Lunge stance x 30 sec  Sharpen rhomberg stance x 15 sec  Step over tape line x 10 bil  Forward/reverse gait 78ft x 4  GCA from PT with cues for use of ankle strategy to correct LOB and improve step length/height to reduce foot drag.   PATIENT EDUCATION: Education details: purpose of functional outcomes. Use of ankle strategy to correct lateral LOB.  Person educated: Patient Education method: Explanation Education comprehension: verbalized understanding  HOME EXERCISE PROGRAM: Access Code: [email or web address] URL: [email or web address] Date: 08/15/2023 Prepared by: Maureen Ralphs  Exercises - Sit to Stand with Arms Crossed  - 1 x daily - 3 x weekly - 3 sets - 10 reps - Standing Hip Abduction with Counter Support  - 1 x daily - 3 x weekly - 3 sets - 10 reps - Standing Heel Raises  - 1 x daily - 3 x weekly - 3 sets - 10 reps  GOALS: Goals reviewed with patient? Yes   SHORT TERM GOALS: Target date: 09/10/2023    Patient will be independent in home exercise program to improve strength/mobility for better functional independence with ADLs. Baseline: Goal status: INITIAL   LONG TERM GOALS: Target date:10/08/2023    Patient will increase FOTO score to equal to or greater than 57 to demonstrate statistically significant improvement in mobility and quality of life.  Baseline: 08/15/2023= 55 Goal status: INITIAL  2.  Patient (> 47 years old) will complete five times sit to stand test in < 15 seconds indicating an increased LE strength and improved balance. Baseline: 16.45 Goal status: INITIAL  3.  Patient will increase Berg Balance score by > 6 points to demonstrate decreased fall risk during functional activities Baseline: 44 Goal status:  INITIAL  4.  Patient will increase 10 meter walk test to >1.51m/s as to improve gait speed for better community ambulation and to reduce fall risk. Baseline: 0.28m/s Goal status: INITIAL  5.  Patient will reduce timed up and go to <11 seconds to reduce fall risk and demonstrate improved transfer/gait ability. Baseline: 11.27 Goal status: INITIAL  6.  Patient will increase dynamic gait index score to >19/24 as to demonstrate reduced fall risk and improved dynamic gait balance for better safety with community/home ambulation.   Baseline: 16 Goal status: INITIAL 6.  Patient will increase 6 min walk test by at least 16ft  as to demonstrate reduced fall risk and improved dynamic gait balance for better safety with community/home ambulation.   Baseline: 08/15/2023= 525 feet with SBQC Goal status: INITIAL   ASSESSMENT:  CLINICAL IMPRESSION: Treatment focused on dynamic balance training with weighted movements. Pt demonstrates mild lateral sway requiring min assist to correct occasionally with cues from PT for improved ankle strategy to correct. Arrived slightly late to treatment on this day, limiting therapy. Patient will benefit from skilled PT services to improve her overall functional LE strength and improve functional mobility for improved quality of life and decreased risk of falling.   OBJECTIVE IMPAIRMENTS: Abnormal gait, cardiopulmonary status limiting activity, decreased activity tolerance, decreased balance, decreased coordination, decreased endurance, decreased knowledge of condition, decreased knowledge of use of DME, difficulty walking, decreased ROM, decreased strength, decreased safety awareness, and improper body mechanics.   ACTIVITY LIMITATIONS: carrying, sitting,  squatting, sleeping, stairs, transfers, and locomotion level  PARTICIPATION LIMITATIONS: laundry, shopping, community activity, and occupation  PERSONAL FACTORS: Age, Behavior pattern, and Education are also  affecting patient's functional outcome.   REHAB POTENTIAL: Good  CLINICAL DECISION MAKING: Stable/uncomplicated  EVALUATION COMPLEXITY: Moderate  PLAN:  PT FREQUENCY: 1x/week  PT DURATION: 8 weeks  PLANNED INTERVENTIONS: Therapeutic exercises, Therapeutic activity, Neuromuscular re-education, Balance training, Gait training, Patient/Family education, Self Care, Joint mobilization, Stair training, Vestibular training, Cryotherapy, Moist heat, Manual therapy, and Re-evaluation  PLAN FOR NEXT SESSION:   Balance HEP.     Golden Pop, PT 08/26/2023, 11:22 AM

## 2023-08-29 ENCOUNTER — Ambulatory Visit: Payer: HMO

## 2023-09-04 DIAGNOSIS — L821 Other seborrheic keratosis: Secondary | ICD-10-CM | POA: Diagnosis not present

## 2023-09-04 DIAGNOSIS — D225 Melanocytic nevi of trunk: Secondary | ICD-10-CM | POA: Diagnosis not present

## 2023-09-04 DIAGNOSIS — D2261 Melanocytic nevi of right upper limb, including shoulder: Secondary | ICD-10-CM | POA: Diagnosis not present

## 2023-09-04 DIAGNOSIS — L570 Actinic keratosis: Secondary | ICD-10-CM | POA: Diagnosis not present

## 2023-09-04 DIAGNOSIS — D2271 Melanocytic nevi of right lower limb, including hip: Secondary | ICD-10-CM | POA: Diagnosis not present

## 2023-09-04 DIAGNOSIS — R202 Paresthesia of skin: Secondary | ICD-10-CM | POA: Diagnosis not present

## 2023-09-04 DIAGNOSIS — D2272 Melanocytic nevi of left lower limb, including hip: Secondary | ICD-10-CM | POA: Diagnosis not present

## 2023-09-04 DIAGNOSIS — D2262 Melanocytic nevi of left upper limb, including shoulder: Secondary | ICD-10-CM | POA: Diagnosis not present

## 2023-09-05 ENCOUNTER — Encounter: Payer: Self-pay | Admitting: Physical Therapy

## 2023-09-05 ENCOUNTER — Ambulatory Visit: Payer: HMO

## 2023-09-05 ENCOUNTER — Ambulatory Visit: Payer: HMO | Attending: Physician Assistant | Admitting: Physical Therapy

## 2023-09-05 DIAGNOSIS — R269 Unspecified abnormalities of gait and mobility: Secondary | ICD-10-CM | POA: Diagnosis not present

## 2023-09-05 DIAGNOSIS — R2681 Unsteadiness on feet: Secondary | ICD-10-CM | POA: Insufficient documentation

## 2023-09-05 DIAGNOSIS — R279 Unspecified lack of coordination: Secondary | ICD-10-CM | POA: Insufficient documentation

## 2023-09-05 NOTE — Therapy (Signed)
OUTPATIENT PHYSICAL THERAPY NEURO TREATMENT   Patient Name: Debra Gutierrez MRN: [id number] DOB:[birth year], 89 y.o., female Today's Date: 09/05/2023   PCP: Patrice Paradise, MD  REFERRING PROVIDER: Patrice Paradise, MD   END OF SESSION:  PT End of Session - 09/05/23 0818     Visit Number 5    Number of Visits 8    Date for PT Re-Evaluation 10/08/23    Progress Note Due on Visit 8    PT Start Time 0815    PT Stop Time 0845    PT Time Calculation (min) 30 min    Equipment Utilized During Treatment Gait belt    Activity Tolerance Patient tolerated treatment well    Behavior During Therapy WFL for tasks assessed/performed              Past Medical History:  Diagnosis Date   Arthritis    Hypertension    Irregular heart beat    Past Surgical History:  Procedure Laterality Date   ABDOMINAL HYSTERECTOMY     BREAST LUMPECTOMY     COLONOSCOPY N/A 09/03/2021   Procedure: COLONOSCOPY;  Surgeon: Jaynie Collins, DO;  Location: [facility] ENDOSCOPY;  Service: Gastroenterology;  Laterality: N/A;   ENDOSCOPIC RETROGRADE CHOLANGIOPANCREATOGRAPHY (ERCP) WITH PROPOFOL N/A 04/25/2022   Procedure: ENDOSCOPIC RETROGRADE CHOLANGIOPANCREATOGRAPHY (ERCP) WITH PROPOFOL;  Surgeon: Midge Minium, MD;  Location: [facility] ENDOSCOPY;  Service: Endoscopy;  Laterality: N/A;   KNEE SURGERY     thumb surgery     UMBILICAL HERNIA REPAIR  04/30/2022   Procedure: HERNIA REPAIR UMBILICAL ADULT;  Surgeon: Leafy Ro, MD;  Location: [facility] ORS;  Service: General;;   WRIST SURGERY     Patient Active Problem List   Diagnosis Date Noted   Prediabetes 04/27/2022   Acute cholecystitis 04/26/2022   Post-ERCP acute pancreatitis 04/26/2022   Cholelithiasis with choledocholithiasis 04/25/2022   Hypertension    Premature atrial complex 10/10/2021   Osteoporosis 03/08/2015   History of total knee arthroplasty, left 05/19/2014   Trigeminal neuralgia of left side of face 09/08/2013    ONSET DATE:  07/10/2023  REFERRING DIAG:  W09.XXXS (ICD-10-CM) - Unspecified fall, sequela  R29.818 (ICD-10-CM) - Other symptoms and signs involving the nervous system    THERAPY DIAG:  Abnormality of gait  Unsteadiness on feet  Rationale for Evaluation and Treatment: Rehabilitation  SUBJECTIVE:                                                                                                                                                                                             SUBJECTIVE STATEMENT: Pt presents at wrong treatment date  but able to be seen due to a no show of another pt. Pt appreciative of being able to be seen despite shortened treatment time.   Pt accompanied by: self  PERTINENT HISTORY:  From recent MD Visit. "89 yo female with history of hypertension, trigeminal neuralgia. Says she was walking in hallway, felt "staggery" and fell forward. No loss of consciousness. She was unable to get up on her own.Had "wind knocked out of her". Went to ER and had xrays, no fractures, neg head CT scan. Still some posterior chest wall pain. Has cane she uses in the morning. Family calls in daily. "  PAIN:  Are you having pain? No  PRECAUTIONS: None  RED FLAGS: Bowel or bladder incontinence: Yes: occasional leakage     WEIGHT BEARING RESTRICTIONS: No  FALLS: Has patient fallen in last 6 months? Yes. Number of falls 1  LIVING ENVIRONMENT: Lives with: lives alone Lives in: House/apartment Stairs: Yes: Internal: 2-3 steps; none and External: 1 or 4  steps; on right going up Has following equipment at home: Single point cane, Walker - 2 wheeled, and does not use frequently    PLOF: Independent, Independent with basic ADLs, and Leisure: sewing    PATIENT GOALS: not sure at start of Evaluation   OBJECTIVE:   DIAGNOSTIC FINDINGS:    CT chest images no pneumothorax or obvious rib injury seen Radiology has read the x-ray is negative for bony abnormality no cardiopulmonary disease. CT  scan of the head and neck are negative  COGNITION: Overall cognitive status: Within functional limits for tasks assessed   SENSATION: Light touch: WFL and reports decreased sensitivity proximal in BLE  Recent increased sensitivtiy at night   COORDINATION: Decreased ROM in the LLE. Coordination WFL bil   EDEMA:  N/a   MUSCLE TONE: N/A    POSTURE: rounded shoulders  LOWER EXTREMITY MMT:      MMT Right Eval Left Eval  Hip flexion 4+ 4  Hip extension    Hip abduction 4+ 4+  Hip adduction 5 5  Hip internal rotation    Hip external rotation    Knee flexion 4+ 4+  Knee extension 5 5  Ankle dorsiflexion 4+ 4  Ankle plantarflexion    Ankle inversion    Ankle eversion     (Blank rows = not tested)   BED MOBILITY:  Indep   TRANSFERS: Assistive device utilized:  none   Sit to stand: Complete Independence Stand to sit: Complete Independence Chair to chair: Complete Independence Floor:  to be assessed     CURB:  Level of Assistance: CGA Assistive device utilized: None Curb Comments: Required intermittent UE support   STAIRS: Level of Assistance: Modified independence Stair Negotiation Technique: Alternating Pattern  with Single Rail on Right Number of Stairs: 8  Height of Stairs: 6  Comments: mild staggering with   GAIT: Gait pattern: scissoring, lateral hip instability, and narrow BOS Distance walked: 9ft Assistive device utilized: None Level of assistance: CGA Comments: mild staggering with adduction intermittently with BLE, pt reports this has been her gait pattern her whole life.   FUNCTIONAL TESTS:  5 times sit to stand: 16.45 Timed up and go (TUG): 11.72 6 minute walk test: TBD 10 meter walk test: 0.37m/s Berg Balance Scale: 44 Dynamic Gait Index: 16  PATIENT SURVEYS:  FOTO To be completed   TODAY'S TREATMENT:  DATE:  09/05/23    Nustep BLE endurance training x 6 min  level 2   PT donned ankel weights (3#). High marching x 10 ea LE  Lateral stepping x 3 laps in // bars  NBOS on airex 3 x 30 sec, improved form intermittent UE support to no UE support on last rep  1 LE on floor, 1 on step 2 x 30 sec ea Forward, retro and step width training x several lengths of  GCA from PT with cues for use of ankle strategy to correct LOB and improve step length/height to reduce foot drag.   PATIENT EDUCATION: Education details: purpose of functional outcomes. Use of ankle strategy to correct lateral LOB.  Person educated: Patient Education method: Explanation Education comprehension: verbalized understanding  HOME EXERCISE PROGRAM: Access Code: [email or web address] URL: [email or web address] Date: 08/15/2023 Prepared by: Maureen Ralphs  Exercises - Sit to Stand with Arms Crossed  - 1 x daily - 3 x weekly - 3 sets - 10 reps - Standing Hip Abduction with Counter Support  - 1 x daily - 3 x weekly - 3 sets - 10 reps - Standing Heel Raises  - 1 x daily - 3 x weekly - 3 sets - 10 reps  GOALS: Goals reviewed with patient? Yes   SHORT TERM GOALS: Target date: 09/10/2023    Patient will be independent in home exercise program to improve strength/mobility for better functional independence with ADLs. Baseline: Goal status: INITIAL   LONG TERM GOALS: Target date:10/08/2023    Patient will increase FOTO score to equal to or greater than 57 to demonstrate statistically significant improvement in mobility and quality of life.  Baseline: 08/15/2023= 55 Goal status: INITIAL  2.  Patient (> 12 years old) will complete five times sit to stand test in < 15 seconds indicating an increased LE strength and improved balance. Baseline: 16.45 Goal status: INITIAL  3.  Patient will increase Berg Balance score by > 6 points to demonstrate decreased fall risk during functional activities Baseline: 44 Goal  status: INITIAL  4.  Patient will increase 10 meter walk test to >1.43m/s as to improve gait speed for better community ambulation and to reduce fall risk. Baseline: 0.68m/s Goal status: INITIAL  5.  Patient will reduce timed up and go to <11 seconds to reduce fall risk and demonstrate improved transfer/gait ability. Baseline: 11.27 Goal status: INITIAL  6.  Patient will increase dynamic gait index score to >19/24 as to demonstrate reduced fall risk and improved dynamic gait balance for better safety with community/home ambulation.   Baseline: 16 Goal status: INITIAL 6.  Patient will increase 6 min walk test by at least 178ft  as to demonstrate reduced fall risk and improved dynamic gait balance for better safety with community/home ambulation.   Baseline: 08/15/2023= 525 feet with SBQC Goal status: INITIAL   ASSESSMENT:  CLINICAL IMPRESSION:  Pt arrived on wrong treatment day but was able to be seen but for a shorter appointment time. Treatment focused on dynamic balance training with weighted movements. Pt challenged with static balance tasks and showed improvement with practice. Pt will continue to benefit from skilled physical therapy intervention to address impairments, improve QOL, and attain therapy goals.     OBJECTIVE IMPAIRMENTS: Abnormal gait, cardiopulmonary status limiting activity, decreased activity tolerance, decreased balance, decreased coordination, decreased endurance, decreased knowledge of condition, decreased knowledge of use of DME, difficulty walking, decreased ROM, decreased strength, decreased safety awareness, and improper body mechanics.   ACTIVITY LIMITATIONS:  carrying, sitting, squatting, sleeping, stairs, transfers, and locomotion level  PARTICIPATION LIMITATIONS: laundry, shopping, community activity, and occupation  PERSONAL FACTORS: Age, Behavior pattern, and Education are also affecting patient's functional outcome.   REHAB POTENTIAL:  Good  CLINICAL DECISION MAKING: Stable/uncomplicated  EVALUATION COMPLEXITY: Moderate  PLAN:  PT FREQUENCY: 1x/week  PT DURATION: 8 weeks  PLANNED INTERVENTIONS: Therapeutic exercises, Therapeutic activity, Neuromuscular re-education, Balance training, Gait training, Patient/Family education, Self Care, Joint mobilization, Stair training, Vestibular training, Cryotherapy, Moist heat, Manual therapy, and Re-evaluation  PLAN FOR NEXT SESSION:   Balance HEP.   Norman Herrlich, PT 09/05/2023, 8:19 AM

## 2023-09-10 ENCOUNTER — Ambulatory Visit: Payer: HMO | Admitting: Physical Therapy

## 2023-09-10 DIAGNOSIS — R279 Unspecified lack of coordination: Secondary | ICD-10-CM

## 2023-09-10 DIAGNOSIS — R269 Unspecified abnormalities of gait and mobility: Secondary | ICD-10-CM

## 2023-09-10 DIAGNOSIS — R2681 Unsteadiness on feet: Secondary | ICD-10-CM

## 2023-09-10 NOTE — Therapy (Signed)
OUTPATIENT PHYSICAL THERAPY NEURO TREATMENT   Patient Name: Debra Gutierrez MRN: [id number] DOB:28-Jun-1930, 89 y.o., female Today's Date: 09/10/2023   PCP: Patrice Paradise, MD  REFERRING PROVIDER: Patrice Paradise, MD   END OF SESSION:  PT End of Session - 09/10/23 0759     Visit Number 6    Number of Visits 8    Date for PT Re-Evaluation 10/08/23    Progress Note Due on Visit 8    PT Start Time 0801    PT Stop Time 0845    PT Time Calculation (min) 44 min    Equipment Utilized During Treatment Gait belt    Activity Tolerance Patient tolerated treatment well    Behavior During Therapy WFL for tasks assessed/performed              Past Medical History:  Diagnosis Date   Arthritis    Hypertension    Irregular heart beat    Past Surgical History:  Procedure Laterality Date   ABDOMINAL HYSTERECTOMY     BREAST LUMPECTOMY     COLONOSCOPY N/A 09/03/2021   Procedure: COLONOSCOPY;  Surgeon: Jaynie Collins, DO;  Location: [facility] ENDOSCOPY;  Service: Gastroenterology;  Laterality: N/A;   ENDOSCOPIC RETROGRADE CHOLANGIOPANCREATOGRAPHY (ERCP) WITH PROPOFOL N/A 04/25/2022   Procedure: ENDOSCOPIC RETROGRADE CHOLANGIOPANCREATOGRAPHY (ERCP) WITH PROPOFOL;  Surgeon: Midge Minium, MD;  Location: [facility] ENDOSCOPY;  Service: Endoscopy;  Laterality: N/A;   KNEE SURGERY     thumb surgery     UMBILICAL HERNIA REPAIR  04/30/2022   Procedure: HERNIA REPAIR UMBILICAL ADULT;  Surgeon: Leafy Ro, MD;  Location: [facility] ORS;  Service: General;;   WRIST SURGERY     Patient Active Problem List   Diagnosis Date Noted   Prediabetes 04/27/2022   Acute cholecystitis 04/26/2022   Post-ERCP acute pancreatitis 04/26/2022   Cholelithiasis with choledocholithiasis 04/25/2022   Hypertension    Premature atrial complex 10/10/2021   Osteoporosis 03/08/2015   History of total knee arthroplasty, left 05/19/2014   Trigeminal neuralgia of left side of face 09/08/2013    ONSET DATE:  07/10/2023  REFERRING DIAG:  Y86.XXXS (ICD-10-CM) - Unspecified fall, sequela  R29.818 (ICD-10-CM) - Other symptoms and signs involving the nervous system    THERAPY DIAG:  Abnormality of gait  Unsteadiness on feet  Unspecified lack of coordination  Rationale for Evaluation and Treatment: Rehabilitation  SUBJECTIVE:                                                                                                                                                                                             SUBJECTIVE STATEMENT: Pt  presents at wrong treatment time, but able to be seen by this PT. Reports no pain, no falls, trips or stumbles since last PT treatment.   Pt accompanied by: self  PERTINENT HISTORY:  From recent MD Visit. "89 yo female with history of hypertension, trigeminal neuralgia. Says she was walking in hallway, felt "staggery" and fell forward. No loss of consciousness. She was unable to get up on her own.Had "wind knocked out of her". Went to ER and had xrays, no fractures, neg head CT scan. Still some posterior chest wall pain. Has cane she uses in the morning. Family calls in daily. "  PAIN:  Are you having pain? No  PRECAUTIONS: None  RED FLAGS: Bowel or bladder incontinence: Yes: occasional leakage     WEIGHT BEARING RESTRICTIONS: No  FALLS: Has patient fallen in last 6 months? Yes. Number of falls 1  LIVING ENVIRONMENT: Lives with: lives alone Lives in: House/apartment Stairs: Yes: Internal: 2-3 steps; none and External: 1 or 4  steps; on right going up Has following equipment at home: Single point cane, Walker - 2 wheeled, and does not use frequently    PLOF: Independent, Independent with basic ADLs, and Leisure: sewing    PATIENT GOALS: not sure at start of Evaluation   OBJECTIVE:   DIAGNOSTIC FINDINGS:    CT chest images no pneumothorax or obvious rib injury seen Radiology has read the x-ray is negative for bony abnormality no cardiopulmonary  disease. CT scan of the head and neck are negative  COGNITION: Overall cognitive status: Within functional limits for tasks assessed   SENSATION: Light touch: WFL and reports decreased sensitivity proximal in BLE  Recent increased sensitivtiy at night   COORDINATION: Decreased ROM in the LLE. Coordination WFL bil   EDEMA:  N/a   MUSCLE TONE: N/A    POSTURE: rounded shoulders  LOWER EXTREMITY MMT:      MMT Right Eval Left Eval  Hip flexion 4+ 4  Hip extension    Hip abduction 4+ 4+  Hip adduction 5 5  Hip internal rotation    Hip external rotation    Knee flexion 4+ 4+  Knee extension 5 5  Ankle dorsiflexion 4+ 4  Ankle plantarflexion    Ankle inversion    Ankle eversion     (Blank rows = not tested)   BED MOBILITY:  Indep   TRANSFERS: Assistive device utilized:  none   Sit to stand: Complete Independence Stand to sit: Complete Independence Chair to chair: Complete Independence Floor:  to be assessed     CURB:  Level of Assistance: CGA Assistive device utilized: None Curb Comments: Required intermittent UE support   STAIRS: Level of Assistance: Modified independence Stair Negotiation Technique: Alternating Pattern  with Single Rail on Right Number of Stairs: 8  Height of Stairs: 6  Comments: mild staggering with   GAIT: Gait pattern: scissoring, lateral hip instability, and narrow BOS Distance walked: 69ft Assistive device utilized: None Level of assistance: CGA Comments: mild staggering with adduction intermittently with BLE, pt reports this has been her gait pattern her whole life.   FUNCTIONAL TESTS:  5 times sit to stand: 16.45 Timed up and go (TUG): 11.72 6 minute walk test: TBD 10 meter walk test: 0.70m/s Berg Balance Scale: 44 Dynamic Gait Index: 16  PATIENT SURVEYS:  FOTO 55  TODAY'S TREATMENT:  DATE:  09/10/23    Nustep BLE endurance training x 6 min level 1-4 cues for consistent RPM throughout variable resistance.     Gait without resistance to perform head turns to identify letters, number, words on sticky notes in hall performed x 4 laps. Mild lateral veer to the R on last 2 laps   PT donned ankel weights (3#). High marching 2x 49ft   Lateral stepping x 4 laps 70ft bil   NBOS/wide BOS on airex 3 x 30 sec each no UE support, improved ability to correct  Forward/ reverse gait 2ft x 3 with cues for improved step length to reduce  5xSTS with BUE x 2  5xSTS without UE support x 2 bouts cues for increased explosiveness   GCA from PT with cues for use of ankle strategy to correct LOB and improve step length/height to reduce foot drag.   PATIENT EDUCATION: Education details: purpose of functional outcomes. Use of ankle strategy to correct lateral LOB.  Importance of wide BOS with lateral and retro movements to reduce fall risk  Person educated: Patient Education method: Explanation Education comprehension: verbalized understanding  HOME EXERCISE PROGRAM: Access Code: [email or web address] URL: [email or web address] Date: 08/15/2023 Prepared by: Maureen Ralphs  Exercises - Sit to Stand with Arms Crossed  - 1 x daily - 3 x weekly - 3 sets - 10 reps - Standing Hip Abduction with Counter Support  - 1 x daily - 3 x weekly - 3 sets - 10 reps - Standing Heel Raises  - 1 x daily - 3 x weekly - 3 sets - 10 reps  GOALS: Goals reviewed with patient? Yes   SHORT TERM GOALS: Target date: 09/10/2023    Patient will be independent in home exercise program to improve strength/mobility for better functional independence with ADLs. Baseline: Goal status: INITIAL   LONG TERM GOALS: Target date:10/08/2023    Patient will increase FOTO score to equal to or greater than 57 to demonstrate statistically significant improvement in mobility and quality of life.  Baseline: 08/15/2023=  55 Goal status: INITIAL  2.  Patient (> 71 years old) will complete five times sit to stand test in < 15 seconds indicating an increased LE strength and improved balance. Baseline: 16.45 Goal status: INITIAL  3.  Patient will increase Berg Balance score by > 6 points to demonstrate decreased fall risk during functional activities Baseline: 44 Goal status: INITIAL  4.  Patient will increase 10 meter walk test to >1.43m/s as to improve gait speed for better community ambulation and to reduce fall risk. Baseline: 0.56m/s Goal status: INITIAL  5.  Patient will reduce timed up and go to <11 seconds to reduce fall risk and demonstrate improved transfer/gait ability. Baseline: 11.27 Goal status: INITIAL  6.  Patient will increase dynamic gait index score to >19/24 as to demonstrate reduced fall risk and improved dynamic gait balance for better safety with community/home ambulation.   Baseline: 16 Goal status: INITIAL  6.  Patient will increase 6 min walk test by at least 128ft  as to demonstrate reduced fall risk and improved dynamic gait balance for better safety with community/home ambulation.   Baseline: 08/15/2023= 525 feet with SBQC Goal status: INITIAL   ASSESSMENT:  CLINICAL IMPRESSION:  Pt arrived at wrong treatment time, but able to be see by this PT. Conitnued . Treatment focused on dynamic balance training with weighted movements. Mild R knee pain with walking march without UE support. Continued to stress importance of high level balance  and gait training to reduce fall risk and improve righting reactions with use of ankle and stepping strategies. Pt will continue to benefit from skilled physical therapy intervention to address impairments, improve QOL, and attain therapy goals.     OBJECTIVE IMPAIRMENTS: Abnormal gait, cardiopulmonary status limiting activity, decreased activity tolerance, decreased balance, decreased coordination, decreased endurance, decreased knowledge of  condition, decreased knowledge of use of DME, difficulty walking, decreased ROM, decreased strength, decreased safety awareness, and improper body mechanics.   ACTIVITY LIMITATIONS: carrying, sitting, squatting, sleeping, stairs, transfers, and locomotion level  PARTICIPATION LIMITATIONS: laundry, shopping, community activity, and occupation  PERSONAL FACTORS: Age, Behavior pattern, and Education are also affecting patient's functional outcome.   REHAB POTENTIAL: Good  CLINICAL DECISION MAKING: Stable/uncomplicated  EVALUATION COMPLEXITY: Moderate  PLAN:  PT FREQUENCY: 1x/week  PT DURATION: 8 weeks  PLANNED INTERVENTIONS: Therapeutic exercises, Therapeutic activity, Neuromuscular re-education, Balance training, Gait training, Patient/Family education, Self Care, Joint mobilization, Stair training, Vestibular training, Cryotherapy, Moist heat, Manual therapy, and Re-evaluation  PLAN FOR NEXT SESSION:   Provide Balance HEP.   Golden Pop, PT 09/10/2023, 8:01 AM

## 2023-09-12 ENCOUNTER — Ambulatory Visit: Payer: HMO

## 2023-09-15 ENCOUNTER — Ambulatory Visit: Payer: HMO | Admitting: Physical Therapy

## 2023-09-17 ENCOUNTER — Ambulatory Visit: Payer: HMO | Admitting: Physical Therapy

## 2023-09-17 DIAGNOSIS — R2681 Unsteadiness on feet: Secondary | ICD-10-CM

## 2023-09-17 DIAGNOSIS — R279 Unspecified lack of coordination: Secondary | ICD-10-CM

## 2023-09-17 DIAGNOSIS — R269 Unspecified abnormalities of gait and mobility: Secondary | ICD-10-CM

## 2023-09-17 NOTE — Therapy (Signed)
OUTPATIENT PHYSICAL THERAPY NEURO TREATMENT   Patient Name: Debra Gutierrez MRN: [id number] DOB:09/17/1930, 89 y.o., female Today's Date: 09/17/2023   PCP: Patrice Paradise, MD  REFERRING PROVIDER: Patrice Paradise, MD   END OF SESSION:  PT End of Session - 09/17/23 0846     Visit Number 7    Number of Visits 8    Date for PT Re-Evaluation 10/08/23    Progress Note Due on Visit 8    PT Start Time 0848    PT Stop Time 0930    PT Time Calculation (min) 42 min    Equipment Utilized During Treatment Gait belt;Right knee immobilizer    Activity Tolerance Patient tolerated treatment well    Behavior During Therapy WFL for tasks assessed/performed              Past Medical History:  Diagnosis Date   Arthritis    Hypertension    Irregular heart beat    Past Surgical History:  Procedure Laterality Date   ABDOMINAL HYSTERECTOMY     BREAST LUMPECTOMY     COLONOSCOPY N/A 09/03/2021   Procedure: COLONOSCOPY;  Surgeon: Jaynie Collins, DO;  Location: [facility] ENDOSCOPY;  Service: Gastroenterology;  Laterality: N/A;   ENDOSCOPIC RETROGRADE CHOLANGIOPANCREATOGRAPHY (ERCP) WITH PROPOFOL N/A 04/25/2022   Procedure: ENDOSCOPIC RETROGRADE CHOLANGIOPANCREATOGRAPHY (ERCP) WITH PROPOFOL;  Surgeon: Midge Minium, MD;  Location: [facility] ENDOSCOPY;  Service: Endoscopy;  Laterality: N/A;   KNEE SURGERY     thumb surgery     UMBILICAL HERNIA REPAIR  04/30/2022   Procedure: HERNIA REPAIR UMBILICAL ADULT;  Surgeon: Leafy Ro, MD;  Location: [facility] ORS;  Service: General;;   WRIST SURGERY     Patient Active Problem List   Diagnosis Date Noted   Prediabetes 04/27/2022   Acute cholecystitis 04/26/2022   Post-ERCP acute pancreatitis 04/26/2022   Cholelithiasis with choledocholithiasis 04/25/2022   Hypertension    Premature atrial complex 10/10/2021   Osteoporosis 03/08/2015   History of total knee arthroplasty, left 05/19/2014   Trigeminal neuralgia of left side of face  09/08/2013    ONSET DATE: 07/10/2023  REFERRING DIAG:  L87.XXXS (ICD-10-CM) - Unspecified fall, sequela  R29.818 (ICD-10-CM) - Other symptoms and signs involving the nervous system    THERAPY DIAG:  Abnormality of gait  Unsteadiness on feet  Unspecified lack of coordination  Rationale for Evaluation and Treatment: Rehabilitation  SUBJECTIVE:                                                                                                                                                                                             SUBJECTIVE  STATEMENT: Pt presents to PT stating that her R knee has been bothering her since last PT treatment. Believes the nustep caused mild irritation and requesting not to use it on this day. Reports mild irritation with gait, but no pain at start of PT session.   Pt accompanied by: self  PERTINENT HISTORY:  From recent MD Visit. "89 yo female with history of hypertension, trigeminal neuralgia. Says she was walking in hallway, felt "staggery" and fell forward. No loss of consciousness. She was unable to get up on her own.Had "wind knocked out of her". Went to ER and had xrays, no fractures, neg head CT scan. Still some posterior chest wall pain. Has cane she uses in the morning. Family calls in daily. "  PAIN:  Are you having pain? No  PRECAUTIONS: None  RED FLAGS: Bowel or bladder incontinence: Yes: occasional leakage     WEIGHT BEARING RESTRICTIONS: No  FALLS: Has patient fallen in last 6 months? Yes. Number of falls 1  LIVING ENVIRONMENT: Lives with: lives alone Lives in: House/apartment Stairs: Yes: Internal: 2-3 steps; none and External: 1 or 4  steps; on right going up Has following equipment at home: Single point cane, Walker - 2 wheeled, and does not use frequently    PLOF: Independent, Independent with basic ADLs, and Leisure: sewing    PATIENT GOALS: not sure at start of Evaluation   OBJECTIVE:   DIAGNOSTIC FINDINGS:    CT  chest images no pneumothorax or obvious rib injury seen Radiology has read the x-ray is negative for bony abnormality no cardiopulmonary disease. CT scan of the head and neck are negative  COGNITION: Overall cognitive status: Within functional limits for tasks assessed   SENSATION: Light touch: WFL and reports decreased sensitivity proximal in BLE  Recent increased sensitivtiy at night   COORDINATION: Decreased ROM in the LLE. Coordination WFL bil   EDEMA:  N/a   MUSCLE TONE: N/A    POSTURE: rounded shoulders  LOWER EXTREMITY MMT:      MMT Right Eval Left Eval  Hip flexion 4+ 4  Hip extension    Hip abduction 4+ 4+  Hip adduction 5 5  Hip internal rotation    Hip external rotation    Knee flexion 4+ 4+  Knee extension 5 5  Ankle dorsiflexion 4+ 4  Ankle plantarflexion    Ankle inversion    Ankle eversion     (Blank rows = not tested)   BED MOBILITY:  Indep   TRANSFERS: Assistive device utilized:  none   Sit to stand: Complete Independence Stand to sit: Complete Independence Chair to chair: Complete Independence Floor:  to be assessed     CURB:  Level of Assistance: CGA Assistive device utilized: None Curb Comments: Required intermittent UE support   STAIRS: Level of Assistance: Modified independence Stair Negotiation Technique: Alternating Pattern  with Single Rail on Right Number of Stairs: 8  Height of Stairs: 6  Comments: mild staggering with   GAIT: Gait pattern: scissoring, lateral hip instability, and narrow BOS Distance walked: 70ft Assistive device utilized: None Level of assistance: CGA Comments: mild staggering with adduction intermittently with BLE, pt reports this has been her gait pattern her whole life.   FUNCTIONAL TESTS:  5 times sit to stand: 16.45 Timed up and go (TUG): 11.72 6 minute walk test: TBD 10 meter walk test: 0.89m/s Berg Balance Scale: 44 Dynamic Gait Index: 16  PATIENT SURVEYS:  FOTO 55  TODAY'S  TREATMENT:  DATE: 09/17/23    Pt declines use of nustep today due to Right knee pain   Standing balance training;  On airex:  Normal BOS 3 x 30 sec  Narrow BOS 2 x 15 sec  Normal BOS eyes closed 2 x 15 sec  Solid surface:  1 foot on 6inch step 2 x 15 sec bil  Feet together 2 x 30 sec  Feet together eyes closed 2 x 15 sec  SLS 2 x 15 sec bil  Tandem 2 x 15 sec bil  Seated LAQ x 12 bil with 2 sec hold    Sit<>stand 2 x 5 with out UE support. Cues for increased speed on second bout.   Gait without resistance performed with QC on this day due to mild knee pain.   PT performed gentle Grade 2 joint mobs AP and PA 2 x 30 sec and longitudinal distraction x 30 sec no change in knee pain reported by pt.    PATIENT EDUCATION: Education details: purpose of functional outcomes. Use of ankle strategy to correct lateral LOB.  Importance of wide BOS with lateral and retro movements to reduce fall risk  Person educated: Patient Education method: Explanation Education comprehension: verbalized understanding  HOME EXERCISE PROGRAM: Access Code: [email or web address] URL: [email or web address] Date: 09/17/2023 Prepared by: Grier Rocher  Exercises - Feet Together Balance at Counter Top Eyes Closed  - 1 x daily - 7 x weekly - 3 sets - 4 reps - 15 hold - Standing Tandem Balance with Counter Support  - 1 x daily - 7 x weekly - 3 sets - 4 reps - 15 hold - Standing Single Leg Stance with Counter Support  - 1 x daily - 7 x weekly - 3 sets - 14 reps - 10 hold  Access Code: [email or web address] URL: [email or web address] Date: 08/15/2023 Prepared by: Maureen Ralphs  Exercises - Sit to Stand with Arms Crossed  - 1 x daily - 3 x weekly - 3 sets - 10 reps - Standing Hip Abduction with Counter Support  - 1 x daily - 3 x weekly - 3 sets - 10 reps - Standing Heel Raises   - 1 x daily - 3 x weekly - 3 sets - 10 reps  GOALS: Goals reviewed with patient? Yes   SHORT TERM GOALS: Target date: 09/10/2023    Patient will be independent in home exercise program to improve strength/mobility for better functional independence with ADLs. Baseline: Goal status: INITIAL   LONG TERM GOALS: Target date:10/08/2023    Patient will increase FOTO score to equal to or greater than 57 to demonstrate statistically significant improvement in mobility and quality of life.  Baseline: 08/15/2023= 55 Goal status: INITIAL  2.  Patient (> 36 years old) will complete five times sit to stand test in < 15 seconds indicating an increased LE strength and improved balance. Baseline: 16.45 Goal status: INITIAL  3.  Patient will increase Berg Balance score by > 6 points to demonstrate decreased fall risk during functional activities Baseline: 44 Goal status: INITIAL  4.  Patient will increase 10 meter walk test to >1.26m/s as to improve gait speed for better community ambulation and to reduce fall risk. Baseline: 0.12m/s Goal status: INITIAL  5.  Patient will reduce timed up and go to <11 seconds to reduce fall risk and demonstrate improved transfer/gait ability. Baseline: 11.27 Goal status: INITIAL  6.  Patient will increase dynamic gait index score to >19/24 as to demonstrate reduced fall risk and improved dynamic gait  balance for better safety with community/home ambulation.   Baseline: 16 Goal status: INITIAL  6.  Patient will increase 6 min walk test by at least 137ft  as to demonstrate reduced fall risk and improved dynamic gait balance for better safety with community/home ambulation.   Baseline: 08/15/2023= 525 feet with SBQC Goal status: INITIAL   ASSESSMENT:  CLINICAL IMPRESSION:  Pt  arrived motivated to participate, but reports increased R knee pain since last PT session. Treatment focused   on static balance training with HEP provided. Pt demonstrated  adequate understanding of HEP with improved ankle strategy to correct posterior and lateral LOB with narrow BOS with increased repetition. Mild R knee pain intermittnetly.. Continued to stress importance of high level balance and gait training to reduce fall risk and improve righting reactions with use of ankle and stepping strategies. Pt will continue to benefit from skilled physical therapy intervention to address impairments, improve QOL, and attain therapy goals.     OBJECTIVE IMPAIRMENTS: Abnormal gait, cardiopulmonary status limiting activity, decreased activity tolerance, decreased balance, decreased coordination, decreased endurance, decreased knowledge of condition, decreased knowledge of use of DME, difficulty walking, decreased ROM, decreased strength, decreased safety awareness, and improper body mechanics.   ACTIVITY LIMITATIONS: carrying, sitting, squatting, sleeping, stairs, transfers, and locomotion level  PARTICIPATION LIMITATIONS: laundry, shopping, community activity, and occupation  PERSONAL FACTORS: Age, Behavior pattern, and Education are also affecting patient's functional outcome.   REHAB POTENTIAL: Good  CLINICAL DECISION MAKING: Stable/uncomplicated  EVALUATION COMPLEXITY: Moderate  PLAN:  PT FREQUENCY: 1x/week  PT DURATION: 8 weeks  PLANNED INTERVENTIONS: Therapeutic exercises, Therapeutic activity, Neuromuscular re-education, Balance training, Gait training, Patient/Family education, Self Care, Joint mobilization, Stair training, Vestibular training, Cryotherapy, Moist heat, Manual therapy, and Re-evaluation  PLAN FOR NEXT SESSION:   D/c assessment   Golden Pop, PT 09/17/2023, 8:50 AM

## 2023-09-24 ENCOUNTER — Ambulatory Visit: Payer: HMO | Admitting: Physical Therapy

## 2023-09-24 DIAGNOSIS — R269 Unspecified abnormalities of gait and mobility: Secondary | ICD-10-CM

## 2023-09-24 DIAGNOSIS — R2681 Unsteadiness on feet: Secondary | ICD-10-CM

## 2023-09-24 DIAGNOSIS — R279 Unspecified lack of coordination: Secondary | ICD-10-CM

## 2023-09-24 NOTE — Therapy (Signed)
OUTPATIENT PHYSICAL THERAPY NEURO TREATMENT/  Discharge summary    Patient Name: Debra Gutierrez MRN: [id number] DOB:11/12/1930, 89 y.o., female Today's Date: 09/24/2023   PCP: Patrice Paradise, MD  REFERRING PROVIDER: Patrice Paradise, MD   END OF SESSION:  PT End of Session - 09/24/23 1025     Visit Number 8    Number of Visits 8    Date for PT Re-Evaluation 10/08/23    Progress Note Due on Visit 8    PT Start Time 1017    PT Stop Time 1100    PT Time Calculation (min) 43 min    Equipment Utilized During Treatment Gait belt;Right knee immobilizer    Activity Tolerance Patient tolerated treatment well    Behavior During Therapy WFL for tasks assessed/performed              Past Medical History:  Diagnosis Date   Arthritis    Hypertension    Irregular heart beat    Past Surgical History:  Procedure Laterality Date   ABDOMINAL HYSTERECTOMY     BREAST LUMPECTOMY     COLONOSCOPY N/A 09/03/2021   Procedure: COLONOSCOPY;  Surgeon: Jaynie Collins, DO;  Location: [facility] ENDOSCOPY;  Service: Gastroenterology;  Laterality: N/A;   ENDOSCOPIC RETROGRADE CHOLANGIOPANCREATOGRAPHY (ERCP) WITH PROPOFOL N/A 04/25/2022   Procedure: ENDOSCOPIC RETROGRADE CHOLANGIOPANCREATOGRAPHY (ERCP) WITH PROPOFOL;  Surgeon: Midge Minium, MD;  Location: [facility] ENDOSCOPY;  Service: Endoscopy;  Laterality: N/A;   KNEE SURGERY     thumb surgery     UMBILICAL HERNIA REPAIR  04/30/2022   Procedure: HERNIA REPAIR UMBILICAL ADULT;  Surgeon: Leafy Ro, MD;  Location: [facility] ORS;  Service: General;;   WRIST SURGERY     Patient Active Problem List   Diagnosis Date Noted   Prediabetes 04/27/2022   Acute cholecystitis 04/26/2022   Post-ERCP acute pancreatitis 04/26/2022   Cholelithiasis with choledocholithiasis 04/25/2022   Hypertension    Premature atrial complex 10/10/2021   Osteoporosis 03/08/2015   History of total knee arthroplasty, left 05/19/2014   Trigeminal neuralgia of  left side of face 09/08/2013    ONSET DATE: 07/10/2023  REFERRING DIAG:  W09.XXXS (ICD-10-CM) - Unspecified fall, sequela  R29.818 (ICD-10-CM) - Other symptoms and signs involving the nervous system    THERAPY DIAG:  Abnormality of gait  Unsteadiness on feet  Unspecified lack of coordination  Rationale for Evaluation and Treatment: Rehabilitation  SUBJECTIVE:  SUBJECTIVE STATEMENT: Pt presents to PT reporting decreased pain in the L knee compared to prior session. States that she is ready for d/c from PT.  Pt accompanied by: self  PERTINENT HISTORY:  From recent MD Visit. "89 yo female with history of hypertension, trigeminal neuralgia. Says she was walking in hallway, felt "staggery" and fell forward. No loss of consciousness. She was unable to get up on her own.Had "wind knocked out of her". Went to ER and had xrays, no fractures, neg head CT scan. Still some posterior chest wall pain. Has cane she uses in the morning. Family calls in daily. "  PAIN:  Are you having pain? No  PRECAUTIONS: None  RED FLAGS: Bowel or bladder incontinence: Yes: occasional leakage     WEIGHT BEARING RESTRICTIONS: No  FALLS: Has patient fallen in last 6 months? Yes. Number of falls 1  LIVING ENVIRONMENT: Lives with: lives alone Lives in: House/apartment Stairs: Yes: Internal: 2-3 steps; none and External: 1 or 4  steps; on right going up Has following equipment at home: Single point cane, Walker - 2 wheeled, and does not use frequently    PLOF: Independent, Independent with basic ADLs, and Leisure: sewing    PATIENT GOALS: not sure at start of Evaluation   OBJECTIVE:   DIAGNOSTIC FINDINGS:    CT chest images no pneumothorax or obvious rib injury seen Radiology has read the x-ray is negative for bony  abnormality no cardiopulmonary disease. CT scan of the head and neck are negative  COGNITION: Overall cognitive status: Within functional limits for tasks assessed   SENSATION: Light touch: WFL and reports decreased sensitivity proximal in BLE  Recent increased sensitivtiy at night   COORDINATION: Decreased ROM in the LLE. Coordination WFL bil   EDEMA:  N/a   MUSCLE TONE: N/A    POSTURE: rounded shoulders  LOWER EXTREMITY MMT:    Completed on 09/24/23  MMT Right Eval Left Eval  Hip flexion 4+ 4+  Hip extension    Hip abduction 4+ 4+  Hip adduction 5 5  Hip internal rotation    Hip external rotation    Knee flexion 4+ 4+  Knee extension 5 5  Ankle dorsiflexion 4+ 4+  Ankle plantarflexion    Ankle inversion    Ankle eversion     (Blank rows = not tested)   BED MOBILITY:  Indep   TRANSFERS: Assistive device utilized:  none   Sit to stand: Complete Independence Stand to sit: Complete Independence Chair to chair: Complete Independence Floor:  to be assessed     CURB:  Level of Assistance: CGA Assistive device utilized: None Curb Comments: Required intermittent UE support   STAIRS: Level of Assistance: Modified independence Stair Negotiation Technique: Alternating Pattern  with Single Rail on Right Number of Stairs: 8  Height of Stairs: 6  Comments: mild staggering with   GAIT: Gait pattern: scissoring, lateral hip instability, and narrow BOS Distance walked: 67ft Assistive device utilized: None Level of assistance: Modified independence Comments: mild staggering with adduction intermittently with BLE, pt reports this has been her gait pattern her whole life.   FUNCTIONAL TESTS:  See goal assessment:   PATIENT SURVEYS:  FOTO 55  TODAY'S TREATMENT:  DATE: 09/24/23    PT instructed pt in Grad day assessment to measure  progress towards LTG.  See below for details    [facility] PT Assessment - 09/24/23 0001       Berg Balance Test   Sit to Stand Able to stand without using hands and stabilize independently    Standing Unsupported Able to stand safely 2 minutes    Sitting with Back Unsupported but Feet Supported on Floor or Stool Able to sit safely and securely 2 minutes    Stand to Sit Sits safely with minimal use of hands    Transfers Able to transfer safely, minor use of hands    Standing Unsupported with Eyes Closed Able to stand 10 seconds safely    Standing Unsupported with Feet Together Able to place feet together independently and stand 1 minute safely    From Standing, Reach Forward with Outstretched Arm Can reach confidently >25 cm (10")    From Standing Position, Pick up Object from Floor Able to pick up shoe safely and easily    From Standing Position, Turn to Look Behind Over each Shoulder Looks behind one side only/other side shows less weight shift    Turn 360 Degrees Able to turn 360 degrees safely one side only in 4 seconds or less    Standing Unsupported, Alternately Place Feet on Step/Stool Able to stand independently and complete 8 steps >20 seconds    Standing Unsupported, One Foot in Front Able to plae foot ahead of the other independently and hold 30 seconds    Standing on One Leg Tries to lift leg/unable to hold 3 seconds but remains standing independently    Total Score 49      Dynamic Gait Index   Level Surface Normal    Change in Gait Speed Mild Impairment    Gait with Horizontal Head Turns Mild Impairment    Gait with Vertical Head Turns Normal    Gait and Pivot Turn Normal    Step Over Obstacle Mild Impairment    Step Around Obstacles Normal    Steps Moderate Impairment    Total Score 19              PATIENT EDUCATION: Education details: purpose of functional outcomes. Use of ankle strategy to correct lateral LOB.  Importance of wide BOS with lateral and retro  movements to reduce fall risk  Person educated: Patient Education method: Explanation Education comprehension: verbalized understanding  HOME EXERCISE PROGRAM: Access Code: [email or web address] URL: [email or web address] Date: 09/17/2023 Prepared by: Grier Rocher  Exercises - Feet Together Balance at Counter Top Eyes Closed  - 1 x daily - 7 x weekly - 3 sets - 4 reps - 15 hold - Standing Tandem Balance with Counter Support  - 1 x daily - 7 x weekly - 3 sets - 4 reps - 15 hold - Standing Single Leg Stance with Counter Support  - 1 x daily - 7 x weekly - 3 sets - 14 reps - 10 hold  Access Code: [email or web address] URL: [email or web address] Date: 08/15/2023 Prepared by: Maureen Ralphs  Exercises - Sit to Stand with Arms Crossed  - 1 x daily - 3 x weekly - 3 sets - 10 reps - Standing Hip Abduction with Counter Support  - 1 x daily - 3 x weekly - 3 sets - 10 reps - Standing Heel Raises  - 1 x daily - 3 x weekly - 3 sets - 10 reps  GOALS: Goals reviewed with patient?  Yes   SHORT TERM GOALS: Target date: 09/10/2023    Patient will be independent in home exercise program to improve strength/mobility for better functional independence with ADLs. Baseline: Goal status: INITIAL   LONG TERM GOALS: Target date:10/08/2023    Patient will increase FOTO score to equal to or greater than 57 to demonstrate statistically significant improvement in mobility and quality of life.  Baseline: 08/15/2023= 55 09/23/53: 51 Goal status: IN PROGRESS  2.  Patient (> 70 years old) will complete five times sit to stand test in < 15 seconds indicating an increased LE strength and improved balance. Baseline: 16.45 09/24/23: 12.86 Goal status: MET  3.  Patient will increase Berg Balance score by > 6 points to demonstrate decreased fall risk during functional activities Baseline: 44 09/24/2023:49 Goal status: IN PROGRESS  4.  Patient will increase 10 meter walk test to >1.40m/s as to  improve gait speed for better community ambulation and to reduce fall risk. Baseline: 0.13m/s 09/24/2023: 1.044m/s (9.825 sec_  Goal status: MET  5.  Patient will reduce timed up and go to <11 seconds to reduce fall risk and demonstrate improved transfer/gait ability. Baseline: 11.27 09/24/2023: 9.35sec  Goal status: MET  6.  Patient will increase dynamic gait index score to >19/24 as t/o demonstrate reduced fall risk and improved dynamic gait balance for better safety with community/home ambulation.   Baseline: 16 09/24/23: 19 Goal status: MET  7.  Patient will increase 6 min walk test by at least 115ft  as to demonstrate reduced fall risk and improved dynamic gait balance for better safety with community/home ambulation.   Baseline: 08/15/2023= 525 feet with SBQC 09/26/2023: 953ft no AD  Goal status: MET    ASSESSMENT:  CLINICAL IMPRESSION:  Pt  arrived motivated to participate. Treatment focused  d/c assessment. Pt has met 5 of 7 LTGs improved score on 6 min walk test, DGI, 5sSTS, TUG and . Pt did not meet goal but demonstrated a 5 point improvement on Berg as well.  Mild reduction in self reported functional on FOTO on this day. Due to improved strength, and balance as evidenced by improved score on functional outcome measure, pt will no longer benefit from skilled PT.      OBJECTIVE IMPAIRMENTS: Abnormal gait, cardiopulmonary status limiting activity, decreased activity tolerance, decreased balance, decreased coordination, decreased endurance, decreased knowledge of condition, decreased knowledge of use of DME, difficulty walking, decreased ROM, decreased strength, decreased safety awareness, and improper body mechanics.   ACTIVITY LIMITATIONS: carrying, sitting, squatting, sleeping, stairs, transfers, and locomotion level  PARTICIPATION LIMITATIONS: laundry, shopping, community activity, and occupation  PERSONAL FACTORS: Age, Behavior pattern, and Education are also  affecting patient's functional outcome.   REHAB POTENTIAL: Good  CLINICAL DECISION MAKING: Stable/uncomplicated  EVALUATION COMPLEXITY: Moderate  PLAN:  PT FREQUENCY: 1x/week  PT DURATION: 8 weeks  PLANNED INTERVENTIONS: Therapeutic exercises, Therapeutic activity, Neuromuscular re-education, Balance training, Gait training, Patient/Family education, Self Care, Joint mobilization, Stair training, Vestibular training, Cryotherapy, Moist heat, Manual therapy, and Re-evaluation  PLAN FOR NEXT SESSION:   Pt D/c'ed from PT.   Golden Pop, PT 09/24/2023, 10:26 AM

## 2023-09-26 ENCOUNTER — Ambulatory Visit: Payer: HMO

## 2023-10-01 ENCOUNTER — Ambulatory Visit: Payer: HMO | Admitting: Physical Therapy

## 2023-10-03 ENCOUNTER — Ambulatory Visit: Payer: HMO

## 2023-10-08 ENCOUNTER — Ambulatory Visit: Payer: HMO

## 2023-10-10 ENCOUNTER — Ambulatory Visit: Payer: HMO

## 2023-10-15 ENCOUNTER — Ambulatory Visit: Payer: HMO

## 2023-10-17 ENCOUNTER — Ambulatory Visit: Payer: HMO

## 2023-10-20 ENCOUNTER — Ambulatory Visit: Payer: HMO

## 2023-10-24 ENCOUNTER — Ambulatory Visit: Payer: HMO

## 2023-10-27 ENCOUNTER — Ambulatory Visit: Payer: HMO

## 2023-10-29 ENCOUNTER — Ambulatory Visit: Payer: HMO

## 2023-11-04 ENCOUNTER — Ambulatory Visit: Payer: HMO

## 2023-11-07 ENCOUNTER — Ambulatory Visit: Payer: HMO

## 2023-11-11 ENCOUNTER — Ambulatory Visit: Payer: HMO

## 2023-11-14 ENCOUNTER — Ambulatory Visit: Payer: HMO

## 2023-11-14 DIAGNOSIS — H5211 Myopia, right eye: Secondary | ICD-10-CM | POA: Diagnosis not present

## 2023-11-14 DIAGNOSIS — H5202 Hypermetropia, left eye: Secondary | ICD-10-CM | POA: Diagnosis not present

## 2023-11-14 DIAGNOSIS — H43813 Vitreous degeneration, bilateral: Secondary | ICD-10-CM | POA: Diagnosis not present

## 2023-11-14 DIAGNOSIS — Z961 Presence of intraocular lens: Secondary | ICD-10-CM | POA: Diagnosis not present

## 2023-11-14 DIAGNOSIS — H04123 Dry eye syndrome of bilateral lacrimal glands: Secondary | ICD-10-CM | POA: Diagnosis not present

## 2023-11-17 ENCOUNTER — Ambulatory Visit: Payer: HMO

## 2023-11-20 ENCOUNTER — Ambulatory Visit: Payer: HMO

## 2023-11-24 ENCOUNTER — Ambulatory Visit: Payer: HMO

## 2023-11-27 ENCOUNTER — Ambulatory Visit: Payer: HMO

## 2023-12-01 ENCOUNTER — Ambulatory Visit: Payer: HMO

## 2023-12-04 ENCOUNTER — Ambulatory Visit: Payer: HMO

## 2023-12-17 DIAGNOSIS — I10 Essential (primary) hypertension: Secondary | ICD-10-CM | POA: Diagnosis not present

## 2023-12-17 DIAGNOSIS — R7303 Prediabetes: Secondary | ICD-10-CM | POA: Diagnosis not present

## 2023-12-24 DIAGNOSIS — E538 Deficiency of other specified B group vitamins: Secondary | ICD-10-CM | POA: Diagnosis not present

## 2023-12-24 DIAGNOSIS — E782 Mixed hyperlipidemia: Secondary | ICD-10-CM | POA: Diagnosis not present

## 2023-12-24 DIAGNOSIS — M810 Age-related osteoporosis without current pathological fracture: Secondary | ICD-10-CM | POA: Diagnosis not present

## 2023-12-24 DIAGNOSIS — I10 Essential (primary) hypertension: Secondary | ICD-10-CM | POA: Diagnosis not present

## 2023-12-24 DIAGNOSIS — R42 Dizziness and giddiness: Secondary | ICD-10-CM | POA: Diagnosis not present

## 2023-12-24 DIAGNOSIS — M150 Primary generalized (osteo)arthritis: Secondary | ICD-10-CM | POA: Diagnosis not present

## 2023-12-24 DIAGNOSIS — R7303 Prediabetes: Secondary | ICD-10-CM | POA: Diagnosis not present

## 2023-12-24 DIAGNOSIS — G500 Trigeminal neuralgia: Secondary | ICD-10-CM | POA: Diagnosis not present

## 2023-12-24 DIAGNOSIS — Z0000 Encounter for general adult medical examination without abnormal findings: Secondary | ICD-10-CM | POA: Diagnosis not present

## 2024-01-15 DIAGNOSIS — R7303 Prediabetes: Secondary | ICD-10-CM | POA: Diagnosis not present

## 2024-01-15 DIAGNOSIS — I491 Atrial premature depolarization: Secondary | ICD-10-CM | POA: Diagnosis not present

## 2024-01-15 DIAGNOSIS — G500 Trigeminal neuralgia: Secondary | ICD-10-CM | POA: Diagnosis not present

## 2024-01-15 DIAGNOSIS — E782 Mixed hyperlipidemia: Secondary | ICD-10-CM | POA: Diagnosis not present

## 2024-01-15 DIAGNOSIS — R42 Dizziness and giddiness: Secondary | ICD-10-CM | POA: Diagnosis not present

## 2024-01-15 DIAGNOSIS — I10 Essential (primary) hypertension: Secondary | ICD-10-CM | POA: Diagnosis not present

## 2024-02-07 ENCOUNTER — Emergency Department
Admission: EM | Admit: 2024-02-07 | Discharge: 2024-02-07 | Disposition: A | Discharge status: Home / Self Care | Attending: Emergency Medicine | Admitting: Emergency Medicine

## 2024-02-07 ENCOUNTER — Emergency Department

## 2024-02-07 ENCOUNTER — Other Ambulatory Visit: Payer: Self-pay

## 2024-02-07 DIAGNOSIS — M19041 Primary osteoarthritis, right hand: Secondary | ICD-10-CM | POA: Diagnosis not present

## 2024-02-07 DIAGNOSIS — M1811 Unilateral primary osteoarthritis of first carpometacarpal joint, right hand: Secondary | ICD-10-CM | POA: Diagnosis not present

## 2024-02-07 NOTE — ED Provider Notes (Signed)
 [place] [facility] DEPARTMENT AT [facility] Provider Note   CSN: [id number] Arrival date & time: 02/07/24  1733     History  Chief Complaint  Patient presents with   Hand Injury    Debra Gutierrez is a 89 y.o. female.  Presents to the emergency department valuation of right thumb pain.  She has pain at the base of the thumb at the [facility] joint.  She has pain with grasping and gripping.  She states she was putting her sheets on the bed and felt a pop along the base of the thumb.  She is able to extend and flex the thumb.  History of right thumb IP fusion years ago.  HPI     Home Medications Prior to Admission medications   Medication Sig Start Date End Date Taking? Authorizing Provider  amLODipine (NORVASC) 2.5 MG tablet Take 2.5 mg by mouth daily.    [provider]  Apple Cider Vinegar 300 MG TABS Take 900 mg by mouth.    [provider]  Cholecalciferol 25 MCG (1000 UT) tablet Take 1,000 Units by mouth daily.    [provider]  Coenzyme Q10 (CO Q 10) 10 MG CAPS Take 10 mg by mouth daily.    [provider]  DOCOSAHEXAENOIC ACID PO Take 1 tablet by mouth daily.    [provider]  lisinopril (PRINIVIL,ZESTRIL) 20 MG tablet Take 20 mg by mouth daily. 04/16/17   [provider]  metoprolol succinate (TOPROL-XL) 25 MG 24 hr tablet Take 25 mg by mouth daily.    [provider]  NON FORMULARY Take 1 Dose by mouth daily.    [provider]  Red Yeast Rice 600 MG CAPS Take 600 mg by mouth.    [provider]      Allergies    Daypro [oxaprozin] and Penicillins    Review of Systems   Review of Systems  Physical Exam Updated Vital Signs BP (!) 183/96 (BP Location: Left Arm)   Pulse 85   Temp 97.7 F (36.5 C) (Oral)   Resp 18   SpO2 98%  Physical Exam Constitutional:      Appearance: She is well-developed.  HENT:     Head: Normocephalic and atraumatic.  Eyes:      Conjunctiva/sclera: Conjunctivae normal.  Cardiovascular:     Rate and Rhythm: Normal rate.  Pulmonary:     Effort: Pulmonary effort is normal. No respiratory distress.  Musculoskeletal:        General: Normal range of motion.     Cervical back: Normal range of motion.     Comments: No pain along the IP joint of the right thumb.  She is tender along the volar and dorsal aspect of the [facility] joint.  No pulley tenderness along the right thumb.  No warmth redness or deformity.  She has good flexion extension of the thumb.  Thenar muscle is present with no atrophy.  She has positive CMC grind test.  Negative Finklestein's test.  Nontender along the distal radius, radial styloid or distal ulna  Skin:    General: Skin is warm.     Findings: No rash.  Neurological:     Mental Status: She is alert and oriented to person, place, and time.  Psychiatric:        Behavior: Behavior normal.        Thought Content: Thought content normal.     ED Results / Procedures / Treatments   Labs (all  labs ordered are listed, but only abnormal results are displayed) Labs Reviewed - No data to display  EKG None  Radiology DG Finger Thumb Right Result Date: 02/07/2024 CLINICAL DATA:  Pain after injury.  Felt a pop. EXAM: RIGHT THUMB 2+V COMPARISON:  None Available. FINDINGS: No acute fracture or dislocation of the thumb. 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 Minor degenerative change of the metacarpal phalangeal joint. Chronic changes of the index finger distal and proximal interphalangeal joints and included third digit proximal interphalangeal joint. The bones are subjectively under mineralized. No focal soft tissue abnormalities. IMPRESSION: 1. No fracture or dislocation of the thumb. 2. 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 Electronically Signed   By: Narda Rutherford M.D.   On: 02/07/2024 18:16    Procedures Procedures    Medications Ordered in  ED Medications - No data to display  ED Course/ Medical Decision Making/ A&P                                 Medical Decision Making Amount and/or Complexity of Data Reviewed Radiology: ordered.   89 year old female with pain along the base of the right thumb at the [facility] joint.  She felt a pop earlier today.  X-rays ordered and independently reviewed by me today are negative for any acute bony abnormality.  She has good flexion extension, no concern for tendon injury.  She does have fusion of the IP joint that appears well and nontender on exam with complete fusion on x-ray.  No catching triggering or locking.  She is placed into a thumb spica splint, will use topical anti-inflammatory cream.  She will use Tylenol 3 times a day and will follow with orthopedics if no improvement 1 week.  She understands signs symptoms return to the ED for. Final Clinical Impression(s) / ED Diagnoses Final diagnoses:  Primary osteoarthritis of first carpometacarpal joint of right hand    Rx / DC Orders ED Discharge Orders     None         Ronnette Juniper 02/07/24 1932    Phineas Semen, MD 02/08/24 1537

## 2024-02-07 NOTE — ED Triage Notes (Signed)
 Pt states she was making her bed this morning and felt a pop in her right hand and c/o severe right thumb pain at this time. Pt AOX4, NAD noted. CMS intact, movement limited by pain. No swelling or obvious deformity noted.

## 2024-02-07 NOTE — Discharge Instructions (Signed)
 Please use topical Voltaren gel or CBD balm all over the base of the thumb.  Take Tylenol 1000 mg 3 times a day.  Use splint as needed for pain.  Call orthopedic office in 1 week if no improvement.

## 2024-06-07 DIAGNOSIS — L821 Other seborrheic keratosis: Secondary | ICD-10-CM | POA: Diagnosis not present

## 2024-06-07 DIAGNOSIS — D2262 Melanocytic nevi of left upper limb, including shoulder: Secondary | ICD-10-CM | POA: Diagnosis not present

## 2024-06-07 DIAGNOSIS — D2271 Melanocytic nevi of right lower limb, including hip: Secondary | ICD-10-CM | POA: Diagnosis not present

## 2024-06-07 DIAGNOSIS — D2272 Melanocytic nevi of left lower limb, including hip: Secondary | ICD-10-CM | POA: Diagnosis not present

## 2024-06-07 DIAGNOSIS — D2261 Melanocytic nevi of right upper limb, including shoulder: Secondary | ICD-10-CM | POA: Diagnosis not present

## 2024-06-07 DIAGNOSIS — D225 Melanocytic nevi of trunk: Secondary | ICD-10-CM | POA: Diagnosis not present

## 2024-06-07 DIAGNOSIS — L570 Actinic keratosis: Secondary | ICD-10-CM | POA: Diagnosis not present

## 2024-06-17 DIAGNOSIS — E782 Mixed hyperlipidemia: Secondary | ICD-10-CM | POA: Diagnosis not present

## 2024-06-17 DIAGNOSIS — I10 Essential (primary) hypertension: Secondary | ICD-10-CM | POA: Diagnosis not present

## 2024-06-17 DIAGNOSIS — Z0000 Encounter for general adult medical examination without abnormal findings: Secondary | ICD-10-CM | POA: Diagnosis not present

## 2024-06-17 DIAGNOSIS — R7303 Prediabetes: Secondary | ICD-10-CM | POA: Diagnosis not present

## 2024-06-18 DIAGNOSIS — I10 Essential (primary) hypertension: Secondary | ICD-10-CM | POA: Diagnosis not present

## 2024-06-18 DIAGNOSIS — R7303 Prediabetes: Secondary | ICD-10-CM | POA: Diagnosis not present

## 2024-06-18 DIAGNOSIS — Z0000 Encounter for general adult medical examination without abnormal findings: Secondary | ICD-10-CM | POA: Diagnosis not present

## 2024-06-18 DIAGNOSIS — E782 Mixed hyperlipidemia: Secondary | ICD-10-CM | POA: Diagnosis not present

## 2024-07-06 DIAGNOSIS — M810 Age-related osteoporosis without current pathological fracture: Secondary | ICD-10-CM | POA: Diagnosis not present

## 2024-07-06 DIAGNOSIS — E782 Mixed hyperlipidemia: Secondary | ICD-10-CM | POA: Diagnosis not present

## 2024-07-06 DIAGNOSIS — M150 Primary generalized (osteo)arthritis: Secondary | ICD-10-CM | POA: Diagnosis not present

## 2024-07-06 DIAGNOSIS — Z1331 Encounter for screening for depression: Secondary | ICD-10-CM | POA: Diagnosis not present

## 2024-07-06 DIAGNOSIS — G500 Trigeminal neuralgia: Secondary | ICD-10-CM | POA: Diagnosis not present

## 2024-07-06 DIAGNOSIS — M25561 Pain in right knee: Secondary | ICD-10-CM | POA: Diagnosis not present

## 2024-07-06 DIAGNOSIS — R7303 Prediabetes: Secondary | ICD-10-CM | POA: Diagnosis not present

## 2024-07-06 DIAGNOSIS — G8929 Other chronic pain: Secondary | ICD-10-CM | POA: Diagnosis not present

## 2024-07-06 DIAGNOSIS — I10 Essential (primary) hypertension: Secondary | ICD-10-CM | POA: Diagnosis not present

## 2024-07-06 DIAGNOSIS — Z0000 Encounter for general adult medical examination without abnormal findings: Secondary | ICD-10-CM | POA: Diagnosis not present

## 2024-07-20 DIAGNOSIS — M1711 Unilateral primary osteoarthritis, right knee: Secondary | ICD-10-CM | POA: Diagnosis not present

## 2024-08-10 DIAGNOSIS — R7303 Prediabetes: Secondary | ICD-10-CM | POA: Diagnosis not present

## 2024-08-10 DIAGNOSIS — S90821A Blister (nonthermal), right foot, initial encounter: Secondary | ICD-10-CM | POA: Diagnosis not present

## 2024-08-20 DIAGNOSIS — M1711 Unilateral primary osteoarthritis, right knee: Secondary | ICD-10-CM | POA: Diagnosis not present

## 2024-08-31 DIAGNOSIS — I10 Essential (primary) hypertension: Secondary | ICD-10-CM | POA: Diagnosis not present

## 2024-08-31 DIAGNOSIS — R42 Dizziness and giddiness: Secondary | ICD-10-CM | POA: Diagnosis not present

## 2024-08-31 DIAGNOSIS — R7303 Prediabetes: Secondary | ICD-10-CM | POA: Diagnosis not present

## 2024-08-31 DIAGNOSIS — I491 Atrial premature depolarization: Secondary | ICD-10-CM | POA: Diagnosis not present

## 2024-08-31 DIAGNOSIS — E782 Mixed hyperlipidemia: Secondary | ICD-10-CM | POA: Diagnosis not present

## 2024-09-09 ENCOUNTER — Ambulatory Visit
Admission: RE | Admit: 2024-09-09 | Discharge: 2024-09-09 | Disposition: A | Discharge status: Home / Self Care | Source: Ambulatory Visit | Attending: Physician Assistant | Admitting: Physician Assistant

## 2024-09-09 ENCOUNTER — Other Ambulatory Visit: Payer: Self-pay | Admitting: Physician Assistant

## 2024-09-09 DIAGNOSIS — I10 Essential (primary) hypertension: Secondary | ICD-10-CM | POA: Diagnosis not present

## 2024-09-09 DIAGNOSIS — G500 Trigeminal neuralgia: Secondary | ICD-10-CM

## 2024-09-09 DIAGNOSIS — H539 Unspecified visual disturbance: Secondary | ICD-10-CM

## 2024-09-09 DIAGNOSIS — R42 Dizziness and giddiness: Secondary | ICD-10-CM | POA: Insufficient documentation

## 2024-09-09 DIAGNOSIS — R799 Abnormal finding of blood chemistry, unspecified: Secondary | ICD-10-CM | POA: Diagnosis not present

## 2024-09-09 DIAGNOSIS — R29818 Other symptoms and signs involving the nervous system: Secondary | ICD-10-CM | POA: Diagnosis not present

## 2024-09-09 DIAGNOSIS — I672 Cerebral atherosclerosis: Secondary | ICD-10-CM | POA: Diagnosis not present
